# Patient Record
Sex: Male | Born: 1970
Health system: Southern US, Community
[De-identification: ages and names within clinical notes are randomized; demographics above are authoritative.]

## PROBLEM LIST (undated history)

## (undated) DIAGNOSIS — K219 Gastro-esophageal reflux disease without esophagitis: Secondary | ICD-10-CM

## (undated) DIAGNOSIS — Z8719 Personal history of other diseases of the digestive system: Secondary | ICD-10-CM

## (undated) HISTORY — PX: EXTERNAL EAR SURGERY: SHX627

## (undated) HISTORY — DX: Personal history of other diseases of the digestive system: Z87.19

## (undated) HISTORY — PX: OTHER SURGICAL HISTORY: SHX169

---

## 2013-07-27 ENCOUNTER — Encounter: Payer: Self-pay | Admitting: Internal Medicine

## 2013-09-14 ENCOUNTER — Encounter: Payer: Self-pay | Admitting: Internal Medicine

## 2013-09-17 ENCOUNTER — Ambulatory Visit: Payer: Self-pay | Admitting: Internal Medicine

## 2014-01-04 ENCOUNTER — Emergency Department (INDEPENDENT_AMBULATORY_CARE_PROVIDER_SITE_OTHER)
Admission: EM | Admit: 2014-01-04 | Discharge: 2014-01-04 | Disposition: A | Discharge status: Home / Self Care | Payer: Self-pay | Source: Home / Self Care | Attending: Emergency Medicine | Admitting: Emergency Medicine

## 2014-01-04 ENCOUNTER — Encounter (HOSPITAL_COMMUNITY): Payer: Self-pay | Admitting: Emergency Medicine

## 2014-01-04 DIAGNOSIS — K649 Unspecified hemorrhoids: Secondary | ICD-10-CM

## 2014-01-04 LAB — OCCULT BLOOD, POC DEVICE: Fecal Occult Bld: NEGATIVE

## 2014-01-04 MED ORDER — HYDROCORTISONE ACETATE 25 MG RE SUPP: 25.0000 mg | Freq: Two times a day (BID) | RECTAL | Status: DC

## 2014-01-04 MED ORDER — DOCUSATE SODIUM 100 MG PO CAPS: 100.0000 mg | ORAL_CAPSULE | Freq: Two times a day (BID) | ORAL | Status: DC

## 2014-01-04 MED ORDER — HYDROCORTISONE 1 % EX CREA: TOPICAL_CREAM | CUTANEOUS | Status: DC

## 2014-01-04 NOTE — ED Provider Notes (Signed)
Chief Complaint   Chief Complaint  Patient presents with  . Hemorrhoids    History of Present Illness   Philip Young is a 43 year old male who has had a one-week history of anal pain, small-volume, bright red hematochezia, with the pain radiating into his testicles and into his back and down his legs. He denies any constipation or diarrhea. He's had no bowel pain, fever, or chills. He denies any dysuria, frequency, urgency, or hematuria. He has no history of hemorrhoids or any other rectal lesions in the past.  Review of Systems   Other than as noted above, the patient denies any of the following symptoms: Constitutional:  No fever, chills, weight loss or anorexia. Lungs:  No shortness of breath. Heart:  No chest pain, dizziness or syncope. Abdomen:  No nausea, vomiting, hematememesis, melena, constipation, or diarrhea.  Philip Young   Past medical history, family history, social history, meds, and allergies were reviewed.    Physical Examination     Vital signs:  BP 112/75  Pulse 75  Temp(Src) 98.5 F (36.9 C) (Oral)  Resp 14  SpO2 99% Gen:  Alert, oriented, in no distress. Lungs:  Breath sounds clear and equal bilaterally.  No wheezes, rales or rhonchi. Heart:  Regular rhythm.  No gallops or murmers.   Abdomen:  Soft, nontender, no organomegaly or mass, bowel sounds were normal. Genital exam: He has several lesions on the penis that may be nevi or papillomas. There is nothing that looks like condyloma acuminata. No evidence of any STD, no urethral discharge. Skin:  Clear, warm and dry.  No rash.  Procedure Note:  Verbal informed consent was obtained from the patient.  Risks and benefits were outlined with the patient.  Patient understands and accepts these risks. A time out was called and the procedure and identity of the patient were confirmed verbally.    The procedure was then performed as follows:  External exam reveals some external hemorrhoid tags. These were not  bleeding. There were inflamed and tender to touch. Anoscopic exam was attempted, but unable to do, due to patient discomfort. Digital rectal exam was uncomfortable but I was able to palpate the help canal, but not the prostate. There were no masses, stool is heme negative.  The patient tolerated the procedure well without any immediate complications.   Labs   Results for orders placed during the hospital encounter of 01/04/14  OCCULT BLOOD, POC DEVICE      Result Value Ref Range   Fecal Occult Bld NEGATIVE  NEGATIVE     Assessment   The encounter diagnosis was Hemorrhoids, unspecified hemorrhoid type.  I wasn't able to see into the anus to make a good diagnosis of hemorrhoids. I feel he needs a colonoscopy since he has had hematochezia. He was given the name of Dr. Owens Loffler. I impressed upon him the importance of following up with Dr. Ardis Hughs whether he feels better or not. He was told that the differential diagnosis includes colon cancer. I also suggested he followup with a urologist for the penile lesions.  Plan   1.  Meds:  The following meds were prescribed:   Discharge Medication List as of 01/04/2014  8:04 PM    START taking these medications   Details  docusate sodium (COLACE) 100 MG capsule Take 1 capsule (100 mg total) by mouth every 12 (twelve) hours., Starting 01/04/2014, Until Discontinued, Normal    hydrocortisone (ANUSOL-HC) 25 MG suppository Place 1 suppository (25 mg total) rectally 2 (two)  times daily., Starting 01/04/2014, Until Discontinued, Normal    hydrocortisone cream 1 % Apply to penis 3 times daily, Normal        2.  Patient Education/Counseling:  The patient was given appropriate handouts, self care instructions, and instructed in symptomatic relief.  Suggested sitz baths and stool softeners.  3.  Follow up:  The patient was told to follow up here if no better in 3 to 4 days, or sooner if becoming worse in any way, and given some red flag symptoms such  as worsening bleeding, abdominal pain, dizziness or syncope which would prompt immediate return.  Followup with Dr. Owens Loffler for the hematochezia and Dr. Alexis Frock for the penile lesions.      Philip Mo, MD 01/04/14 2100

## 2014-01-04 NOTE — ED Notes (Signed)
Patient c/o pain and burning with bowel movements x 10 days. Patient denies blood in stool. Patient is alert and oriented and in NAD.

## 2014-01-04 NOTE — Discharge Instructions (Signed)

## 2014-03-06 ENCOUNTER — Emergency Department (HOSPITAL_COMMUNITY)
Admission: EM | Admit: 2014-03-06 | Discharge: 2014-03-06 | Disposition: A | Discharge status: Home / Self Care | Payer: Self-pay | Attending: Emergency Medicine | Admitting: Emergency Medicine

## 2014-03-06 ENCOUNTER — Emergency Department (HOSPITAL_COMMUNITY): Payer: Self-pay

## 2014-03-06 ENCOUNTER — Encounter (HOSPITAL_COMMUNITY): Payer: Self-pay | Admitting: Emergency Medicine

## 2014-03-06 DIAGNOSIS — Y9389 Activity, other specified: Secondary | ICD-10-CM | POA: Insufficient documentation

## 2014-03-06 DIAGNOSIS — S5002XA Contusion of left elbow, initial encounter: Secondary | ICD-10-CM | POA: Insufficient documentation

## 2014-03-06 DIAGNOSIS — S7012XA Contusion of left thigh, initial encounter: Secondary | ICD-10-CM | POA: Insufficient documentation

## 2014-03-06 DIAGNOSIS — Y998 Other external cause status: Secondary | ICD-10-CM | POA: Insufficient documentation

## 2014-03-06 DIAGNOSIS — S0990XA Unspecified injury of head, initial encounter: Secondary | ICD-10-CM | POA: Insufficient documentation

## 2014-03-06 DIAGNOSIS — Y92488 Other paved roadways as the place of occurrence of the external cause: Secondary | ICD-10-CM | POA: Insufficient documentation

## 2014-03-06 MED ORDER — IBUPROFEN 800 MG PO TABS: 800.0000 mg | ORAL_TABLET | Freq: Three times a day (TID) | ORAL | Status: DC

## 2014-03-06 MED ORDER — ACETAMINOPHEN 500 MG PO TABS: 500.0000 mg | ORAL_TABLET | Freq: Four times a day (QID) | ORAL | Status: DC | PRN

## 2014-03-06 MED ORDER — IBUPROFEN 800 MG PO TABS
800.0000 mg | ORAL_TABLET | Freq: Once | ORAL | Status: AC
  Administered 2014-03-06: 800 mg via ORAL
  Filled 2014-03-06: qty 1
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
## 2014-07-30 ENCOUNTER — Encounter: Payer: Self-pay | Admitting: Internal Medicine

## 2014-07-30 ENCOUNTER — Ambulatory Visit: Payer: Self-pay | Attending: Internal Medicine | Admitting: Internal Medicine

## 2014-07-30 VITALS — BP 133/78 | HR 75 | Temp 98.0°F | Resp 16 | Wt 206.8 lb

## 2014-07-30 DIAGNOSIS — D229 Melanocytic nevi, unspecified: Secondary | ICD-10-CM | POA: Insufficient documentation

## 2014-07-30 DIAGNOSIS — Z833 Family history of diabetes mellitus: Secondary | ICD-10-CM | POA: Insufficient documentation

## 2014-07-30 DIAGNOSIS — Z8719 Personal history of other diseases of the digestive system: Secondary | ICD-10-CM

## 2014-07-30 DIAGNOSIS — H538 Other visual disturbances: Secondary | ICD-10-CM | POA: Insufficient documentation

## 2014-07-30 DIAGNOSIS — K029 Dental caries, unspecified: Secondary | ICD-10-CM | POA: Insufficient documentation

## 2014-07-30 DIAGNOSIS — Z87898 Personal history of other specified conditions: Secondary | ICD-10-CM

## 2014-07-30 DIAGNOSIS — G8929 Other chronic pain: Secondary | ICD-10-CM | POA: Insufficient documentation

## 2014-07-30 DIAGNOSIS — M545 Low back pain: Secondary | ICD-10-CM | POA: Insufficient documentation

## 2014-07-30 DIAGNOSIS — Z0000 Encounter for general adult medical examination without abnormal findings: Secondary | ICD-10-CM | POA: Insufficient documentation

## 2014-07-30 DIAGNOSIS — K649 Unspecified hemorrhoids: Secondary | ICD-10-CM | POA: Insufficient documentation

## 2014-07-30 DIAGNOSIS — D225 Melanocytic nevi of trunk: Secondary | ICD-10-CM | POA: Insufficient documentation

## 2014-07-30 DIAGNOSIS — Z791 Long term (current) use of non-steroidal anti-inflammatories (NSAID): Secondary | ICD-10-CM | POA: Insufficient documentation

## 2014-07-30 HISTORY — DX: Personal history of other diseases of the digestive system: Z87.19

## 2014-07-30 LAB — COMPLETE METABOLIC PANEL WITH GFR
ALBUMIN: 4.4 g/dL (ref 3.5–5.2)
ALT: 48 U/L (ref 0–53)
AST: 30 U/L (ref 0–37)
Alkaline Phosphatase: 60 U/L (ref 39–117)
BILIRUBIN TOTAL: 0.5 mg/dL (ref 0.2–1.2)
BUN: 11 mg/dL (ref 6–23)
CO2: 23 mEq/L (ref 19–32)
CREATININE: 0.75 mg/dL (ref 0.50–1.35)
Calcium: 9.2 mg/dL (ref 8.4–10.5)
Chloride: 104 mEq/L (ref 96–112)
Glucose, Bld: 107 mg/dL — ABNORMAL HIGH (ref 70–99)
POTASSIUM: 4 meq/L (ref 3.5–5.3)
Sodium: 139 mEq/L (ref 135–145)
Total Protein: 6.9 g/dL (ref 6.0–8.3)

## 2014-07-30 LAB — TSH: TSH: 0.847 u[IU]/mL (ref 0.350–4.500)

## 2014-07-30 LAB — CBC WITH DIFFERENTIAL/PLATELET
Basophils Absolute: 0.1 10*3/uL (ref 0.0–0.1)
Basophils Relative: 1 % (ref 0–1)
EOS ABS: 0.1 10*3/uL (ref 0.0–0.7)
EOS PCT: 1 % (ref 0–5)
HEMATOCRIT: 44.1 % (ref 39.0–52.0)
HEMOGLOBIN: 15.3 g/dL (ref 13.0–17.0)
Lymphocytes Relative: 28 % (ref 12–46)
Lymphs Abs: 1.7 10*3/uL (ref 0.7–4.0)
MCH: 29.6 pg (ref 26.0–34.0)
MCHC: 34.7 g/dL (ref 30.0–36.0)
MCV: 85.3 fL (ref 78.0–100.0)
MONOS PCT: 11 % (ref 3–12)
MPV: 11.5 fL (ref 8.6–12.4)
Monocytes Absolute: 0.7 10*3/uL (ref 0.1–1.0)
Neutro Abs: 3.5 10*3/uL (ref 1.7–7.7)
Neutrophils Relative %: 59 % (ref 43–77)
Platelets: 218 10*3/uL (ref 150–400)
RBC: 5.17 MIL/uL (ref 4.22–5.81)
RDW: 13.5 % (ref 11.5–15.5)
WBC: 6 10*3/uL (ref 4.0–10.5)

## 2014-07-30 LAB — HEMOGLOBIN A1C
Hgb A1c MFr Bld: 5.9 % — ABNORMAL HIGH (ref <5.7)
Mean Plasma Glucose: 123 mg/dL — ABNORMAL HIGH (ref <117)

## 2014-07-30 MED ORDER — HYDROCORTISONE ACETATE 25 MG RE SUPP: 25.0000 mg | Freq: Two times a day (BID) | RECTAL | Status: DC

## 2014-07-30 MED ORDER — HYDROCORTISONE 1 % EX CREA: TOPICAL_CREAM | CUTANEOUS | Status: DC

## 2014-07-30 NOTE — Patient Instructions (Signed)

## 2014-07-30 NOTE — Progress Notes (Signed)
Patient here to establish care Patient states has not been to a doctor in a while and needs a physical Complains of hemorrhoids and using a cream to subside Also complains of having sciatic nerve
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
## 2014-07-31 ENCOUNTER — Telehealth: Payer: Self-pay

## 2014-07-31 LAB — VITAMIN D 25 HYDROXY (VIT D DEFICIENCY, FRACTURES): Vit D, 25-Hydroxy: 16 ng/mL — ABNORMAL LOW (ref 30–100)

## 2014-07-31 MED ORDER — VITAMIN D (ERGOCALCIFEROL) 1.25 MG (50000 UNIT) PO CAPS: 50000.0000 [IU] | ORAL_CAPSULE | ORAL | Status: DC

## 2014-07-31 NOTE — Telephone Encounter (Signed)
-----   Message from Lorayne Marek, MD sent at 07/31/2014 10:31 AM EDT ----- Blood work reviewed, noticed low vitamin D, call patient advise to start ergocalciferol 50,000 units once a week for the duration of  12 weeks, then take OTC vitamin d 2000 units daily. noticed hemoglobin A1c of 5.9%, patient has prediabetes, call and advise patient for low carbohydrate diet.

## 2014-07-31 NOTE — Telephone Encounter (Signed)
Patient is returning call from nurse, please f/u with pt.

## 2014-07-31 NOTE — Telephone Encounter (Signed)
Patient not available Left message on voice mail to return our call Prescription for vitamin D sent to [facility] on file

## 2014-08-01 NOTE — Telephone Encounter (Signed)
Patient is returning call from nurse, please f/u with pt.

## 2014-08-02 NOTE — Telephone Encounter (Signed)
Already had returned patient phone call and he is aware of his and his wife's results

## 2014-08-05 ENCOUNTER — Telehealth: Payer: Self-pay

## 2014-08-05 ENCOUNTER — Telehealth: Payer: Self-pay | Admitting: Internal Medicine

## 2014-08-05 NOTE — Telephone Encounter (Signed)
Patient called requesting to speak to nurse regarding medication, please f/u with patient

## 2014-08-05 NOTE — Telephone Encounter (Signed)
Returned patient phone call patient is having a hard time paying for his prescription And would like the prescriptions for his wife sent to our pharmacy

## 2014-08-07 ENCOUNTER — Telehealth: Payer: Self-pay

## 2014-08-07 NOTE — Telephone Encounter (Signed)
Returned patient phone call Patient not available Unable to leave message-mail box was full

## 2014-08-13 ENCOUNTER — Other Ambulatory Visit: Payer: Self-pay | Admitting: *Deleted

## 2014-08-13 ENCOUNTER — Telehealth: Payer: Self-pay | Admitting: Internal Medicine

## 2014-08-13 ENCOUNTER — Telehealth: Payer: Self-pay

## 2014-08-13 DIAGNOSIS — Z8719 Personal history of other diseases of the digestive system: Secondary | ICD-10-CM

## 2014-08-13 MED ORDER — HYDROCORTISONE ACETATE 25 MG RE SUPP: 25.0000 mg | Freq: Two times a day (BID) | RECTAL | Status: DC

## 2014-08-13 MED ORDER — HYDROCORTISONE 1 % EX CREA: TOPICAL_CREAM | CUTANEOUS | Status: DC

## 2014-08-13 MED ORDER — VITAMIN D (ERGOCALCIFEROL) 1.25 MG (50000 UNIT) PO CAPS: 50000.0000 [IU] | ORAL_CAPSULE | ORAL | Status: DC

## 2014-08-13 NOTE — Telephone Encounter (Signed)
Pt does not want to use [facility] because the medication is very expensive. Please follow up with [facility] to have his medications switched over to our pharmacy. Pt said that [facility] tried asking [facility] that but they were not able to do it for some reason.

## 2014-08-13 NOTE — Telephone Encounter (Signed)
Spoke with patient and he would like his and his wifes prescriptions transferred from [facility] [address] [facility]

## 2014-08-13 NOTE — Telephone Encounter (Signed)
Patient wanting his Rx transferred to our pharmacy

## 2014-09-16 ENCOUNTER — Encounter: Payer: Self-pay | Admitting: Internal Medicine

## 2014-09-16 ENCOUNTER — Ambulatory Visit: Payer: Self-pay | Attending: Internal Medicine | Admitting: Internal Medicine

## 2014-09-16 VITALS — BP 109/72 | HR 74 | Temp 98.0°F | Resp 16 | Wt 203.0 lb

## 2014-09-16 DIAGNOSIS — L298 Other pruritus: Secondary | ICD-10-CM | POA: Insufficient documentation

## 2014-09-16 DIAGNOSIS — S3093XA Unspecified superficial injury of penis, initial encounter: Secondary | ICD-10-CM | POA: Insufficient documentation

## 2014-09-16 DIAGNOSIS — K649 Unspecified hemorrhoids: Secondary | ICD-10-CM | POA: Insufficient documentation

## 2014-09-16 DIAGNOSIS — E559 Vitamin D deficiency, unspecified: Secondary | ICD-10-CM

## 2014-09-16 DIAGNOSIS — Z8719 Personal history of other diseases of the digestive system: Secondary | ICD-10-CM

## 2014-09-16 DIAGNOSIS — Z791 Long term (current) use of non-steroidal anti-inflammatories (NSAID): Secondary | ICD-10-CM | POA: Insufficient documentation

## 2014-09-16 DIAGNOSIS — Z87898 Personal history of other specified conditions: Secondary | ICD-10-CM

## 2014-09-16 DIAGNOSIS — X58XXXA Exposure to other specified factors, initial encounter: Secondary | ICD-10-CM | POA: Insufficient documentation

## 2014-09-16 DIAGNOSIS — L299 Pruritus, unspecified: Secondary | ICD-10-CM

## 2014-09-16 LAB — POCT URINALYSIS DIPSTICK
Bilirubin, UA: NEGATIVE
Glucose, UA: NEGATIVE
Ketones, UA: NEGATIVE
Leukocytes, UA: NEGATIVE
Nitrite, UA: NEGATIVE
RBC UA: NEGATIVE
SPEC GRAV UA: 1.025
Urobilinogen, UA: 0.2
pH, UA: 7

## 2014-09-16 MED ORDER — HYDROCORTISONE ACETATE 25 MG RE SUPP: 25.0000 mg | Freq: Two times a day (BID) | RECTAL | Status: DC

## 2014-09-16 MED ORDER — HYDROCORTISONE 1 % EX CREA
TOPICAL_CREAM | CUTANEOUS | Status: DC
Start: 2014-09-16 — End: 2015-09-17

## 2014-09-16 NOTE — Progress Notes (Signed)
Patient states his wife has some type of infection in her vaginal area Patient states now he is having some itchiness to his penis area

## 2014-09-16 NOTE — Progress Notes (Signed)
MRN: [id number] Name: [name]  Sex: male Age: 44 y.o. DOB: December 18, 1970  Allergies: Review of patient's allergies indicates no known allergies.  Chief Complaint  Patient presents with  . penile itch    HPI: Patient is 44 y.o. male who has history of hemorrhoids, he reported to have some itching in the penile area, denies any urinary symptoms denies any rash denies any discharge, patient reported to have his wife having some vaginal discharge, previous blood work reviewed with the patient noticed vitamin D deficiency patient is given prescription he is taking 50,000 units once a week.  History reviewed. No pertinent past medical history.  Past Surgical History  Procedure Laterality Date  . History of nose surgery         Medication List       This list is accurate as of: 09/16/14  3:13 PM.  Always use your most recent med list.               acetaminophen 500 MG tablet  Commonly known as:  TYLENOL  Take 1 tablet (500 mg total) by mouth every 6 (six) hours as needed.     docusate sodium 100 MG capsule  Commonly known as:  COLACE  Take 1 capsule (100 mg total) by mouth every 12 (twelve) hours.     hydrocortisone 25 MG suppository  Commonly known as:  ANUSOL-HC  Place 1 suppository (25 mg total) rectally 2 (two) times daily.     hydrocortisone cream 1 %  Apply to penis 3 times daily     ibuprofen 800 MG tablet  Commonly known as:  ADVIL,MOTRIN  Take 1 tablet (800 mg total) by mouth 3 (three) times daily.     Vitamin D (Ergocalciferol) 50000 UNITS Caps capsule  Commonly known as:  DRISDOL  Take 1 capsule (50,000 Units total) by mouth every 7 (seven) days.        Meds ordered this encounter  Medications  . hydrocortisone (ANUSOL-HC) 25 MG suppository    Sig: Place 1 suppository (25 mg total) rectally 2 (two) times daily.    Dispense:  12 suppository    Refill:  3  . hydrocortisone cream 1 %    Sig: Apply to penis 3 times daily    Dispense:  30 g   Refill:  2     There is no immunization history on file for this patient.  Family History  Problem Relation Age of Onset  . Diabetes Mother   . Cancer Mother     colon cancer   . Heart disease Father   . Stroke Father     History  Substance Use Topics  . Smoking status: Never Smoker   . Smokeless tobacco: Not on file  . Alcohol Use: No    Review of Systems   As noted in HPI  Filed Vitals:   09/16/14 1441  BP: 109/72  Pulse: 74  Temp: 98 F (36.7 C)  Resp: 16    Physical Exam  Physical Exam  Constitutional: No distress.  Eyes: EOM are normal. Pupils are equal, round, and reactive to light.  Cardiovascular: Normal rate and regular rhythm.   Pulmonary/Chest: Breath sounds normal. No respiratory distress. He has no wheezes. He has no rales.  Genitourinary:  Minor superficial cut noticed on shaft of penis no apparent discharge, no other rash or lymphadenopathy, testes nontender    CBC    Component Value Date/Time   WBC 6.0 07/30/2014 1514   RBC  5.17 07/30/2014 1514   HGB 15.3 07/30/2014 1514   HCT 44.1 07/30/2014 1514   PLT 218 07/30/2014 1514   MCV 85.3 07/30/2014 1514   LYMPHSABS 1.7 07/30/2014 1514   MONOABS 0.7 07/30/2014 1514   EOSABS 0.1 07/30/2014 1514   BASOSABS 0.1 07/30/2014 1514    CMP     Component Value Date/Time   NA 139 07/30/2014 1514   K 4.0 07/30/2014 1514   CL 104 07/30/2014 1514   CO2 23 07/30/2014 1514   GLUCOSE 107* 07/30/2014 1514   BUN 11 07/30/2014 1514   CREATININE 0.75 07/30/2014 1514   CALCIUM 9.2 07/30/2014 1514   PROT 6.9 07/30/2014 1514   ALBUMIN 4.4 07/30/2014 1514   AST 30 07/30/2014 1514   ALT 48 07/30/2014 1514   ALKPHOS 60 07/30/2014 1514   BILITOT 0.5 07/30/2014 1514   GFRNONAA >89 07/30/2014 1514   GFRAA >89 07/30/2014 1514    No results found for: CHOL  Lab Results  Component Value Date/Time   HGBA1C 5.9* 07/30/2014 03:14 PM    Lab Results  Component Value Date/Time   AST 30 07/30/2014 03:14  PM    Assessment and Plan  Penile Itch - Plan:  Results for orders placed or performed in visit on 09/16/14  Urinalysis Dipstick  Result Value Ref Range   Color, UA yellow    Clarity, UA clear    Glucose, UA neg    Bilirubin, UA neg    Ketones, UA neg    Spec Grav, UA 1.025    Blood, UA neg    pH, UA 7.0    Protein, UA trace    Urobilinogen, UA 0.2    Nitrite, UA neg    Leukocytes, UA Negative    Urinalysis Dipstick negative for infection, examination benign, minor superficial cut, have prescribed hydrocortisone cream 1 %, patient can also try over-the-counter A&D ointment,patient's wife is going to be tested for vaginal discharge, if she is positive for any STDs, patient will be treated as well.  History of hemorrhoids - Plan: hydrocortisone (ANUSOL-HC) 25 MG suppository, hydrocortisone cream 1 %  Vitamin D deficiency Patient start taking vitamin D supplement   Return in about 3 months (around 12/17/2014), or if symptoms worsen or fail to improve.   This note has been created with Surveyor, quantity. Any transcriptional errors are unintentional.    Lorayne Marek, MD

## 2014-09-17 ENCOUNTER — Telehealth: Payer: Self-pay | Admitting: Internal Medicine

## 2014-09-17 NOTE — Telephone Encounter (Signed)
Patient had an appointment yesterday and after discharge asked if he could put in a request for Potassium and Vitamin b12; please f/u with patient about this request

## 2015-05-26 ENCOUNTER — Ambulatory Visit: Payer: Self-pay | Admitting: Internal Medicine

## 2015-08-13 ENCOUNTER — Ambulatory Visit: Payer: Self-pay

## 2015-09-16 ENCOUNTER — Encounter (HOSPITAL_COMMUNITY): Payer: Self-pay | Admitting: *Deleted

## 2015-09-16 DIAGNOSIS — N200 Calculus of kidney: Secondary | ICD-10-CM | POA: Insufficient documentation

## 2015-09-16 DIAGNOSIS — Z79899 Other long term (current) drug therapy: Secondary | ICD-10-CM | POA: Insufficient documentation

## 2015-09-16 LAB — CBC WITH DIFFERENTIAL/PLATELET
BASOS ABS: 0 10*3/uL (ref 0.0–0.1)
BASOS PCT: 1 %
Eosinophils Absolute: 0.1 10*3/uL (ref 0.0–0.7)
Eosinophils Relative: 1 %
HEMATOCRIT: 40.8 % (ref 39.0–52.0)
HEMOGLOBIN: 14.2 g/dL (ref 13.0–17.0)
Lymphocytes Relative: 36 %
Lymphs Abs: 2.1 10*3/uL (ref 0.7–4.0)
MCH: 29.2 pg (ref 26.0–34.0)
MCHC: 34.8 g/dL (ref 30.0–36.0)
MCV: 84 fL (ref 78.0–100.0)
Monocytes Absolute: 0.8 10*3/uL (ref 0.1–1.0)
Monocytes Relative: 14 %
NEUTROS ABS: 2.9 10*3/uL (ref 1.7–7.7)
Neutrophils Relative %: 49 %
Platelets: 207 10*3/uL (ref 150–400)
RBC: 4.86 MIL/uL (ref 4.22–5.81)
RDW: 12.3 % (ref 11.5–15.5)
WBC: 5.8 10*3/uL (ref 4.0–10.5)

## 2015-09-16 MED ORDER — OXYCODONE-ACETAMINOPHEN 5-325 MG PO TABS
1.0000 | ORAL_TABLET | Freq: Once | ORAL | Status: AC
  Administered 2015-09-16: 1 via ORAL

## 2015-09-16 MED ORDER — OXYCODONE-ACETAMINOPHEN 5-325 MG PO TABS
ORAL_TABLET | ORAL | Status: AC
  Filled 2015-09-16: qty 1

## 2015-09-16 NOTE — ED Notes (Signed)
Patient initially stated he was having trouble using the bathroom - voided small amount dark urine.  Then he stated he was having trouble having a BM and asked this nurse for something to make him use the bathroom easier.  The pain comes around the left side to the left groin and abd area.  Stated he was fasting then he ate some vegetables and started with this pain

## 2015-09-16 NOTE — ED Notes (Signed)
Patient presents with difficulty urinating

## 2015-09-17 ENCOUNTER — Emergency Department (HOSPITAL_COMMUNITY)
Admission: EM | Admit: 2015-09-17 | Discharge: 2015-09-17 | Disposition: A | Discharge status: Home / Self Care | Payer: Self-pay | Attending: Emergency Medicine | Admitting: Emergency Medicine

## 2015-09-17 ENCOUNTER — Emergency Department (HOSPITAL_COMMUNITY): Payer: Self-pay

## 2015-09-17 ENCOUNTER — Encounter (HOSPITAL_COMMUNITY): Payer: Self-pay | Admitting: Radiology

## 2015-09-17 DIAGNOSIS — N200 Calculus of kidney: Secondary | ICD-10-CM

## 2015-09-17 LAB — URINALYSIS, ROUTINE W REFLEX MICROSCOPIC
BILIRUBIN URINE: NEGATIVE
Glucose, UA: NEGATIVE mg/dL
Ketones, ur: 15 mg/dL — AB
Leukocytes, UA: NEGATIVE
Nitrite: NEGATIVE
Protein, ur: 30 mg/dL — AB
SPECIFIC GRAVITY, URINE: 1.033 — AB (ref 1.005–1.030)
pH: 6 (ref 5.0–8.0)

## 2015-09-17 LAB — BASIC METABOLIC PANEL
Anion gap: 6 (ref 5–15)
BUN: 10 mg/dL (ref 6–20)
CHLORIDE: 107 mmol/L (ref 101–111)
CO2: 25 mmol/L (ref 22–32)
CREATININE: 1 mg/dL (ref 0.61–1.24)
Calcium: 9.1 mg/dL (ref 8.9–10.3)
GFR calc Af Amer: >60 mL/min (ref >60)
GFR calc non Af Amer: >60 mL/min (ref >60)
Glucose, Bld: 116 mg/dL — ABNORMAL HIGH (ref 65–99)
Potassium: 3.9 mmol/L (ref 3.5–5.1)
Sodium: 138 mmol/L (ref 135–145)

## 2015-09-17 LAB — URINE MICROSCOPIC-ADD ON

## 2015-09-17 MED ORDER — OXYCODONE-ACETAMINOPHEN 5-325 MG PO TABS: 1.0000 | ORAL_TABLET | Freq: Four times a day (QID) | ORAL | Status: DC | PRN

## 2015-09-17 MED ORDER — TAMSULOSIN HCL 0.4 MG PO CAPS: 0.4000 mg | ORAL_CAPSULE | Freq: Every day | ORAL | Status: DC

## 2015-09-17 MED ORDER — KETOROLAC TROMETHAMINE 30 MG/ML IJ SOLN
30.0000 mg | Freq: Once | INTRAMUSCULAR | Status: AC
  Administered 2015-09-17: 30 mg via INTRAVENOUS
  Filled 2015-09-17: qty 1

## 2015-09-17 MED ORDER — ONDANSETRON 4 MG PO TBDP: 4.0000 mg | ORAL_TABLET | Freq: Three times a day (TID) | ORAL | Status: DC | PRN

## 2015-09-17 MED ORDER — IBUPROFEN 800 MG PO TABS: 800.0000 mg | ORAL_TABLET | Freq: Three times a day (TID) | ORAL | Status: DC | PRN

## 2015-09-17 MED ORDER — SODIUM CHLORIDE 0.9 % IV BOLUS (SEPSIS)
1000.0000 mL | Freq: Once | INTRAVENOUS | Status: AC
  Administered 2015-09-17: 1000 mL via INTRAVENOUS

## 2015-09-17 MED ORDER — ONDANSETRON HCL 4 MG/2ML IJ SOLN
4.0000 mg | Freq: Once | INTRAMUSCULAR | Status: AC
  Administered 2015-09-17: 4 mg via INTRAVENOUS
  Filled 2015-09-17: qty 2

## 2015-09-17 MED FILL — ONDANSETRON ODT 4 MG TABLET: 4 | 6 days supply | Qty: 20 | Fill #0

## 2015-09-17 MED FILL — IBUPROFEN 800 MG TABLET: 800 | 10 days supply | Qty: 30 | Fill #0

## 2015-09-17 MED FILL — TAMSULOSIN HCL 0.4 MG CAP: 0.4 | 15 days supply | Qty: 15 | Fill #0

## 2015-09-17 MED FILL — OXYCODONE/APAP 5-325: 5-325 | 3 days supply | Qty: 20 | Fill #0

## 2015-09-17 NOTE — ED Provider Notes (Signed)
By signing my name below, I, Randa Evens, attest that this documentation has been prepared under the direction and in the presence of [employer], DO. Electronically Signed: Randa Evens, ED Scribe. 09/17/2015. 3:22 AM.  TIME SEEN: 3:22 AM  CHIEF COMPLAINT: difficulty urinating   HPI: HPI Comments: Philip Young is a 45 y.o. male who presents to the Emergency Department complaining of difficulty urinating onset today. Pt reports that he has associated dysuria and left flank pain radiating to his abdomen. Pt states that his symptoms were worse today after eating spicy food. Pt states that he also believes that his symptoms are worse because he is fasting. He doesn't report any aggravating or alleviating factors. Denies nausea, vomiting or fever. Denies HX of abdominal surgery. No history of back injury. No history of kidney stones.    ROS: See HPI Constitutional: no fever  Eyes: no drainage  ENT: no runny nose   Cardiovascular:  no chest pain  Resp: no SOB  GI: no vomiting GU:  dysuria Integumentary: no rash  Allergy: no hives  Musculoskeletal: no leg swelling  Neurological: no slurred speech ROS otherwise negative  PAST MEDICAL HISTORY/PAST SURGICAL HISTORY:  History reviewed. No pertinent past medical history.  MEDICATIONS:  Prior to Admission medications   Medication Sig Start Date End Date Taking? Authorizing Provider  acetaminophen (TYLENOL) 500 MG tablet Take 1 tablet (500 mg total) by mouth every 6 (six) hours as needed. Patient taking differently: Take 500 mg by mouth every 6 (six) hours as needed for mild pain.  03/06/14  Yes Jennifer Piepenbrink, PA-C  docusate sodium (COLACE) 100 MG capsule Take 1 capsule (100 mg total) by mouth every 12 (twelve) hours. 01/04/14  Yes Harden Mo, MD    ALLERGIES:  No Known Allergies  SOCIAL HISTORY:  Social History  Substance Use Topics  . Smoking status: Never Smoker   . Smokeless tobacco: Never Used  . Alcohol  Use: No    FAMILY HISTORY: Family History  Problem Relation Age of Onset  . Diabetes Mother   . Cancer Mother     colon cancer   . Heart disease Father   . Stroke Father     EXAM: BP 128/69 mmHg  Pulse 78  Temp(Src) 97.5 F (36.4 C) (Oral)  Resp 18  Ht 5\' 7"  (1.702 m)  Wt 210 lb (95.255 kg)  BMI 32.88 kg/m2  SpO2 96%   CONSTITUTIONAL: Alert and oriented and responds appropriately to questions. Well-appearing; well-nourished HEAD: Normocephalic EYES: Conjunctivae clear, PERRL ENT: normal nose; no rhinorrhea; dry mucous membranes NECK: Supple, no meningismus, no LAD  CARD: RRR; S1 and S2 appreciated; no murmurs, no clicks, no rubs, no gallops RESP: Normal chest excursion without splinting or tachypnea; breath sounds clear and equal bilaterally; no wheezes, no rhonchi, no rales, no hypoxia or respiratory distress, speaking full sentences ABD/GI: Normal bowel sounds; non-distended; soft, non-tender, no rebound, no guarding, no peritoneal signs BACK:  The back appears normal and is non-tender to palpation, there is no CVA tenderness EXT: Normal ROM in all joints; non-tender to palpation; no edema; normal capillary refill; no cyanosis, no calf tenderness or swelling    SKIN: Normal color for age and race; warm; no rash NEURO: Moves all extremities equally, sensation to light touch intact diffusely, cranial nerves II through XII intact PSYCH: The patient's mood and manner are appropriate. Grooming and personal hygiene are appropriate.  MEDICAL DECISION MAKING: Patient here with left flank pain that radiates into the abdomen,  pain with urination. He has been able to urinate several times in the emergency department. Suspect possible kidney stone. Urine shows blood but no other sign of infection. Labs unremarkable. We'll obtain CT of his abdomen and pelvis. We'll give IV fluids, Toradol, Zofran.  ED PROGRESS: Patient reports feeling much better. He is smiling, laughing. CT scan showed  a 2 mm distal left ureteral calculus with mild hydronephrosis. We'll discharge with prescriptions for Percocet, ibuprofen, Flomax, Zofran. We'll give urine strainer and outpatient urology follow-up information. Have recommended increased fluid intake. Discussed return precautions. Patient verbalizes understanding and is comfortable with this plan.    At this time, I do not feel there is any life-threatening condition present. I have reviewed and discussed all results (EKG, imaging, lab, urine as appropriate), exam findings with patient. I have reviewed nursing notes and appropriate previous records.  I feel the patient is safe to be discharged home without further emergent workup. Discussed usual and customary return precautions. Patient and family (if present) verbalize understanding and are comfortable with this plan.  Patient will follow-up with their primary care provider. If they do not have a primary care provider, information for follow-up has been provided to them. All questions have been answered.  I personally performed the services described in this documentation, which was scribed in my presence. The recorded information has been reviewed and is accurate.    [place], DO 09/17/15 0505

## 2015-09-17 NOTE — ED Notes (Signed)
Patient verbalized understanding of discharge instructions and denies any further needs or questions at this time. VS stable. Patient ambulatory with steady gait, assisted to ED entrance in wheelchair.  

## 2015-09-17 NOTE — ED Notes (Signed)
Provided patient with urine strainer. 

## 2015-09-17 NOTE — Discharge Instructions (Signed)
Dietary Guidelines to Help Prevent Kidney Stones °Your risk of kidney stones can be decreased by adjusting the foods you eat. The most important thing you can do is drink enough fluid. You should drink enough fluid to keep your urine clear or pale yellow. The following guidelines provide specific information for the type of kidney stone you have had. °GUIDELINES ACCORDING TO TYPE OF KIDNEY STONE °Calcium Oxalate Kidney Stones °· Reduce the amount of salt you eat. Foods that have a lot of salt cause your body to release excess calcium into your urine. The excess calcium can combine with a substance called oxalate to form kidney stones. °· Reduce the amount of animal protein you eat if the amount you eat is excessive. Animal protein causes your body to release excess calcium into your urine. Ask your dietitian how much protein from animal sources you should be eating. °· Avoid foods that are high in oxalates. If you take vitamins, they should have less than 500 mg of vitamin C. Your body turns vitamin C into oxalates. You do not need to avoid fruits and vegetables high in vitamin C. °Calcium Phosphate Kidney Stones °· Reduce the amount of salt you eat to help prevent the release of excess calcium into your urine. °· Reduce the amount of animal protein you eat if the amount you eat is excessive. Animal protein causes your body to release excess calcium into your urine. Ask your dietitian how much protein from animal sources you should be eating. °· Get enough calcium from food or take a calcium supplement (ask your dietitian for recommendations). Food sources of calcium that do not increase your risk of kidney stones include: °· Broccoli. °· Dairy products, such as cheese and yogurt. °· Pudding. °Uric Acid Kidney Stones °· Do not have more than 6 oz of animal protein per day. °FOOD SOURCES °Animal Protein Sources °· Meat (all types). °· Poultry. °· Eggs. °· Fish, seafood. °Foods High in Salt °· Salt seasonings. °· Soy  sauce. °· Teriyaki sauce. °· Cured and processed meats. °· Salted crackers and snack foods. °· Fast food. °· Canned soups and most canned foods. °Foods High in Oxalates °· Grains: °· Amaranth. °· Barley. °· Grits. °· Wheat germ. °· Bran. °· Buckwheat flour. °· All bran cereals. °· Pretzels. °· Whole wheat bread. °· Vegetables: °· Beans (wax). °· Beets and beet greens. °· Collard greens. °· Eggplant. °· Escarole. °· Leeks. °· Okra. °· Parsley. °· Rutabagas. °· Spinach. °· Swiss chard. °· Tomato paste. °· Fried potatoes. °· Sweet potatoes. °· Fruits: °· Red currants. °· Figs. °· Kiwi. °· Rhubarb. °· Meat and Other Protein Sources: °· Beans (dried). °· Soy burgers and other soybean products. °· Miso. °· Nuts (peanuts, almonds, pecans, cashews, hazelnuts). °· Nut butters. °· Sesame seeds and tahini (paste made of sesame seeds). °· Poppy seeds. °· Beverages: °· Chocolate drink mixes. °· Soy milk. °· Instant iced tea. °· Juices made from high-oxalate fruits or vegetables. °· Other: °· Carob. °· Chocolate. °· Fruitcake. °· Marmalades. °  °This information is not intended to replace advice given to you by your health care provider. Make sure you discuss any questions you have with your health care provider. °  °Document Released: 07/24/2010 Document Revised: 04/03/2013 Document Reviewed: 02/23/2013 °Elsevier Interactive Patient Education ©2016 Else[employer] Inc[employer] ° °Kidney Stones °Kidney stones (urolithiasis) are deposits that form inside your kidneys. The intense pain is caused by the stone moving through the urinary tract. When the stone moves, the ureter   goes into spasm around the stone. The stone is usually passed in the urine.  °CAUSES  °· A disorder that makes certain neck glands produce too much parathyroid hormone (primary hyperparathyroidism). °· A buildup of uric acid crystals, similar to gout in your joints. °· Narrowing (stricture) of the ureter. °· A kidney obstruction present at birth (congenital  obstruction). °· Previous surgery on the kidney or ureters. °· Numerous kidney infections. °SYMPTOMS  °· Feeling sick to your stomach (nauseous). °· Throwing up (vomiting). °· Blood in the urine (hematuria). °· Pain that usually spreads (radiates) to the groin. °· Frequency or urgency of urination. °DIAGNOSIS  °· Taking a history and physical exam. °· Blood or urine tests. °· CT scan. °· Occasionally, an examination of the inside of the urinary bladder (cystoscopy) is performed. °TREATMENT  °· Observation. °· Increasing your fluid intake. °· Extracorporeal shock wave lithotripsy--This is a noninvasive procedure that uses shock waves to break up kidney stones. °· Surgery may be needed if you have severe pain or persistent obstruction. There are various surgical procedures. Most of the procedures are performed with the use of small instruments. Only small incisions are needed to accommodate these instruments, so recovery time is minimized. °The size, location, and chemical composition are all important variables that will determine the proper choice of action for you. Talk to your health care provider to better understand your situation so that you will minimize the risk of injury to yourself and your kidney.  °HOME CARE INSTRUCTIONS  °· Drink enough water and fluids to keep your urine clear or pale yellow. This will help you to pass the stone or stone fragments. °· Strain all urine through the provided strainer. Keep all particulate matter and stones for your health care provider to see. The stone causing the pain may be as small as a grain of salt. It is very important to use the strainer each and every time you pass your urine. The collection of your stone will allow your health care provider to analyze it and verify that a stone has actually passed. The stone analysis will often identify what you can do to reduce the incidence of recurrences. °· Only take over-the-counter or prescription medicines for pain,  discomfort, or fever as directed by your health care provider. °· Keep all follow-up visits as told by your health care provider. This is important. °· Get follow-up X-rays if required. The absence of pain does not always mean that the stone has passed. It may have only stopped moving. If the urine remains completely obstructed, it can cause loss of kidney function or even complete destruction of the kidney. It is your responsibility to make sure X-rays and follow-ups are completed. Ultrasounds of the kidney can show blockages and the status of the kidney. Ultrasounds are not associated with any radiation and can be performed easily in a matter of minutes. °· Make changes to your daily diet as told by your health care provider. You may be told to: °¨ Limit the amount of salt that you eat. °¨ Eat 5 or more servings of fruits and vegetables each day. °¨ Limit the amount of meat, poultry, fish, and eggs that you eat. °· Collect a 24-hour urine sample as told by your health care provider. You may need to collect another urine sample every 6-12 months. °SEEK MEDICAL CARE IF: °· You experience pain that is progressive and unresponsive to any pain medicine you have been prescribed. °SEEK IMMEDIATE MEDICAL CARE IF:  °· Pain   cannot be controlled with the prescribed medicine.  You have a fever or shaking chills.  The severity or intensity of pain increases over 18 hours and is not relieved by pain medicine.  You develop a new onset of abdominal pain.  You feel faint or pass out.  You are unable to urinate.   This information is not intended to replace advice given to you by your health care provider. Make sure you discuss any questions you have with your health care provider.   Document Released: 03/29/2005 Document Revised: 12/18/2014 Document Reviewed: 08/30/2012 Elsevier Interactive Patient Education [employer].

## 2015-09-26 ENCOUNTER — Ambulatory Visit: Payer: Self-pay | Attending: Internal Medicine

## 2015-10-08 ENCOUNTER — Ambulatory Visit: Payer: Self-pay | Admitting: Family Medicine

## 2015-11-10 ENCOUNTER — Encounter: Payer: Self-pay | Admitting: Family Medicine

## 2015-11-10 ENCOUNTER — Ambulatory Visit: Payer: Self-pay | Attending: Family Medicine | Admitting: Family Medicine

## 2015-11-10 VITALS — BP 124/77 | HR 90 | Temp 98.6°F | Ht 70.25 in | Wt 205.4 lb

## 2015-11-10 DIAGNOSIS — N489 Disorder of penis, unspecified: Secondary | ICD-10-CM

## 2015-11-10 DIAGNOSIS — Z79899 Other long term (current) drug therapy: Secondary | ICD-10-CM | POA: Insufficient documentation

## 2015-11-10 DIAGNOSIS — M722 Plantar fascial fibromatosis: Secondary | ICD-10-CM | POA: Insufficient documentation

## 2015-11-10 DIAGNOSIS — H9202 Otalgia, left ear: Secondary | ICD-10-CM | POA: Insufficient documentation

## 2015-11-10 DIAGNOSIS — R888 Abnormal findings in other body fluids and substances: Secondary | ICD-10-CM | POA: Insufficient documentation

## 2015-11-10 DIAGNOSIS — Z13228 Encounter for screening for other metabolic disorders: Secondary | ICD-10-CM

## 2015-11-10 DIAGNOSIS — B353 Tinea pedis: Secondary | ICD-10-CM | POA: Insufficient documentation

## 2015-11-10 MED ORDER — MELOXICAM 7.5 MG PO TABS: 7.5000 mg | ORAL_TABLET | Freq: Every day | ORAL | 1 refills | Status: DC

## 2015-11-10 MED ORDER — TERBINAFINE HCL 1 % EX CREA: 1.0000 "application " | TOPICAL_CREAM | Freq: Two times a day (BID) | CUTANEOUS | 1 refills | Status: DC

## 2015-11-10 MED FILL — MELOXICAM 7.5 MG TABLET: 7.5 | 30 days supply | Qty: 30 | Fill #0

## 2015-11-10 NOTE — Progress Notes (Signed)
Subjective:  Patient ID: Philip Young, male    DOB: 1970-07-07  Age: 45 y.o. MRN: [id number]  CC: Annual Exam; Otalgia (left ear); Sore Throat (4 days); Foot Pain (left greater then right  dry peeling skin that itches); and Other (brown skin on the penis)   HPI [name] presents With multiple complaints.  Complains of pain in the heel of his left foot worse on waking up in the morning which occurs daily, also has intermittent cramping of his calf 2 times a week which is absent at this time. He has itching in between the toes of his left foot and some peeling of the skin of his right foot. Complains of left ear pain and sore throat for 4 days but denies runny nose, sinus congestion, fever or headaches. He is concerned about some brownish discoloration of his penis but denies discharge.  He would like to be screened for cancer and is wondering what options are available. I have discussed with him screening for colon cancer from 50 and above unless if he has GI symptoms. (Patient's mom was diagnosed with cancer at age of 80) We have also discussed prostate cancer screening and the risk and benefit.  He does smoke intermittently and I have advised him that if he is concerned about cancer it would be in his interest to quit smoking.  Outpatient Medications Prior to Visit  Medication Sig Dispense Refill  . docusate sodium (COLACE) 100 MG capsule Take 1 capsule (100 mg total) by mouth every 12 (twelve) hours. 30 capsule 0  . ibuprofen (ADVIL,MOTRIN) 800 MG tablet Take 1 tablet (800 mg total) by mouth every 8 (eight) hours as needed for mild pain. (Patient not taking: Reported on 11/10/2015) 30 tablet 0  . ondansetron (ZOFRAN ODT) 4 MG disintegrating tablet Take 1 tablet (4 mg total) by mouth every 8 (eight) hours as needed for nausea or vomiting. (Patient not taking: Reported on 11/10/2015) 20 tablet 0  . oxyCODONE-acetaminophen (PERCOCET/ROXICET) 5-325 MG tablet Take 1-2 tablets by mouth  every 6 (six) hours as needed. (Patient not taking: Reported on 11/10/2015) 20 tablet 0  . tamsulosin (FLOMAX) 0.4 MG CAPS capsule Take 1 capsule (0.4 mg total) by mouth daily. (Patient not taking: Reported on 11/10/2015) 15 capsule 0   No facility-administered medications prior to visit.     ROS Review of Systems  Constitutional: Negative for activity change and appetite change.  HENT: Positive for ear pain and sore throat. Negative for sinus pressure.   Eyes: Negative for visual disturbance.  Respiratory: Negative for cough, chest tightness and shortness of breath.   Cardiovascular: Negative for chest pain and leg swelling.  Gastrointestinal: Negative for abdominal distention, abdominal pain, constipation and diarrhea.  Endocrine: Negative.   Genitourinary: Negative for discharge, dysuria and penile pain.  Musculoskeletal: Negative for joint swelling and myalgias.  Skin: Positive for rash.  Allergic/Immunologic: Negative.   Neurological: Negative for weakness, light-headedness and numbness.  Psychiatric/Behavioral: Negative for dysphoric mood and suicidal ideas.    Objective:  BP 124/77 (BP Location: Left Arm, Patient Position: Sitting, Cuff Size: Large)   Pulse 90   Temp 98.6 F (37 C) (Oral)   Ht 5' 10.25" (1.784 m)   Wt 205 lb 6.4 oz (93.2 kg)   SpO2 97%   BMI 29.26 kg/m   BP/Weight 11/10/2015 [id number]  Systolic BP [id number] -  Diastolic BP 77 77 -  Wt. (Lbs) 205.4 - 210  BMI 29.26 32.88 -  Physical Exam  Constitutional: He is oriented to person, place, and time. He appears well-developed and well-nourished.  HENT:  Left ear-cerumen obscuring TM Right ear-no cerumen  Cardiovascular: Normal rate, normal heart sounds and intact distal pulses.   No murmur heard. Pulmonary/Chest: Effort normal and breath sounds normal. He has no wheezes. He has no rales. He exhibits no tenderness.  Abdominal: Soft. Bowel sounds are normal. He exhibits no distension and no  mass. There is no tenderness.  Genitourinary:  Genitourinary Comments: Brownish colored rings sparsely distributed around penis. No penile discharge  Musculoskeletal: Normal range of motion.  Neurological: He is alert and oriented to person, place, and time.  Skin:  Tinea pedis in webspaces of left foot     Assessment & Plan:   1. Otalgia, left Secondary to cerumen impaction Advised to use OTC ceruminolytics  2. Plantar fasciitis of left foot Advised to use insole - meloxicam (MOBIC) 7.5 MG tablet; Take 1 tablet (7.5 mg total) by mouth daily.  Dispense: 30 tablet; Refill: 1  3. Penile abnormality He does have some brownish discoloration of his penis but no evidence of STD - GC/Chlamydia Probe Amp  4. Tinea pedis of left foot - terbinafine (LAMISIL AT) 1 % cream; Apply 1 application topically 2 (two) times daily.  Dispense: 30 g; Refill: 1  5. Screening for metabolic disorder - COMPLETE METABOLIC PANEL WITH GFR; Future - Lipid panel; Future   Meds ordered this encounter  Medications  . terbinafine (LAMISIL AT) 1 % cream    Sig: Apply 1 application topically 2 (two) times daily.    Dispense:  30 g    Refill:  1  . meloxicam (MOBIC) 7.5 MG tablet    Sig: Take 1 tablet (7.5 mg total) by mouth daily.    Dispense:  30 tablet    Refill:  1    Follow-up: Return in about 6 months (around 05/12/2016) for coordination of care.   Arnoldo Morale MD

## 2015-11-11 NOTE — Addendum Note (Signed)
Addended by: Tommas Olp B on: 11/11/2015 02:58 PM   Modules accepted: Orders

## 2015-11-12 LAB — CYTOLOGY, (ORAL, ANAL, URETHRAL) ANCILLARY ONLY
CHLAMYDIA, DNA PROBE: NEGATIVE
NEISSERIA GONORRHEA: NEGATIVE

## 2015-11-13 ENCOUNTER — Other Ambulatory Visit: Payer: Self-pay

## 2015-11-14 ENCOUNTER — Telehealth: Payer: Self-pay

## 2015-11-14 NOTE — Telephone Encounter (Signed)
-----   Message from Arnoldo Morale, MD sent at 11/12/2015  1:53 PM EDT ----- Please inform the patient that labs are normal. Thank you.

## 2015-11-14 NOTE — Telephone Encounter (Signed)
Writer called patient per Dr. Jarold Song to inform him that his culture results were both negative.  Patient stated understanding.

## 2015-11-19 ENCOUNTER — Other Ambulatory Visit: Payer: Self-pay

## 2016-03-12 ENCOUNTER — Ambulatory Visit: Payer: Self-pay | Attending: Family Medicine

## 2016-05-14 ENCOUNTER — Encounter: Payer: Self-pay | Admitting: Physician Assistant

## 2016-05-14 ENCOUNTER — Ambulatory Visit: Payer: Self-pay | Attending: Physician Assistant | Admitting: Physician Assistant

## 2016-05-14 ENCOUNTER — Telehealth: Payer: Self-pay | Admitting: *Deleted

## 2016-05-14 VITALS — BP 127/82 | HR 84 | Temp 98.4°F | Resp 20 | Wt 213.4 lb

## 2016-05-14 DIAGNOSIS — R1031 Right lower quadrant pain: Secondary | ICD-10-CM

## 2016-05-14 DIAGNOSIS — B9789 Other viral agents as the cause of diseases classified elsewhere: Secondary | ICD-10-CM

## 2016-05-14 DIAGNOSIS — M722 Plantar fascial fibromatosis: Secondary | ICD-10-CM

## 2016-05-14 DIAGNOSIS — J069 Acute upper respiratory infection, unspecified: Secondary | ICD-10-CM

## 2016-05-14 MED ORDER — SALINE SPRAY 0.65 % NA SOLN: 1.0000 | NASAL | 0 refills | Status: DC | PRN

## 2016-05-14 MED ORDER — IBUPROFEN 800 MG PO TABS: 800.0000 mg | ORAL_TABLET | Freq: Three times a day (TID) | ORAL | 0 refills | Status: DC | PRN

## 2016-05-14 MED ORDER — PHENYLEPHRINE-DM-GG-APAP 5-10-200-325 MG/10ML PO LIQD: 20.0000 mL | ORAL | 0 refills | Status: AC

## 2016-05-14 NOTE — Progress Notes (Signed)
Patient ID: Philip Young, male   DOB: May 24, 1970, 46 y.o.   MRN: [id number]    Subjective:  Patient ID: Philip Young, male    DOB: 1970-09-14  Age: 46 y.o. MRN: [id number]  CC: Cold, foot pain, RLQ pain  HPI Philip Young is a 46 y.o. male with no significant PMH presents with cold symptoms x2 days, left arch and heel pain x one week, and RLQ abdominal pain that radiates to the right testicle for an undetermined amount of time.   1.Cold- Daughter has been ill with a cold since last week. She has not needed antibiotics and is doing better now. Patient has used Ginger tea to help with symptoms but still has nasal congestion and cough. Denies SOB, chest pain, fever, chills, headache, or rash.  2.Left heel pain- Pain worse with first steps of the day or when standing from a seated or supine position. Has not taken anything for pain. No other pain elsewhere in the LLE. Denies weakness or paresthesia.  3.RLQ pain- pain is felt in the right inguinal at times. Pain radiates to the right testicle. Does not notice testicular swelling or abnormality. Has not taken anything for relief. Urinary function intact. Denies fever, chills, nausea, vomiting, or abdominal pain elsewhere.  Outpatient Medications Prior to Visit  Medication Sig Dispense Refill  . docusate sodium (COLACE) 100 MG capsule Take 1 capsule (100 mg total) by mouth every 12 (twelve) hours. 30 capsule 0  . meloxicam (MOBIC) 7.5 MG tablet Take 1 tablet (7.5 mg total) by mouth daily. (Patient not taking: Reported on 05/14/2016) 30 tablet 1  . ondansetron (ZOFRAN ODT) 4 MG disintegrating tablet Take 1 tablet (4 mg total) by mouth every 8 (eight) hours as needed for nausea or vomiting. (Patient not taking: Reported on 11/10/2015) 20 tablet 0  . oxyCODONE-acetaminophen (PERCOCET/ROXICET) 5-325 MG tablet Take 1-2 tablets by mouth every 6 (six) hours as needed. (Patient not taking: Reported on 11/10/2015) 20 tablet 0  . tamsulosin (FLOMAX) 0.4 MG  CAPS capsule Take 1 capsule (0.4 mg total) by mouth daily. (Patient not taking: Reported on 11/10/2015) 15 capsule 0  . terbinafine (LAMISIL AT) 1 % cream Apply 1 application topically 2 (two) times daily. (Patient not taking: Reported on 05/14/2016) 30 g 1  . ibuprofen (ADVIL,MOTRIN) 800 MG tablet Take 1 tablet (800 mg total) by mouth every 8 (eight) hours as needed for mild pain. (Patient not taking: Reported on 11/10/2015) 30 tablet 0   No facility-administered medications prior to visit.     ROS Review of Systems  Constitutional: Negative for chills, fatigue and fever.  HENT: Positive for rhinorrhea and sinus pressure. Negative for ear pain, sinus pain and sore throat.   Eyes: Negative for pain.  Respiratory: Positive for cough. Negative for shortness of breath.   Cardiovascular: Negative for chest pain.  Gastrointestinal: Negative for abdominal pain.  Genitourinary: Negative for difficulty urinating and dysuria.  Musculoskeletal: Positive for gait problem (left foot pain). Negative for back pain.  Skin: Negative for rash and wound.  Neurological: Positive for headaches (attributed to URI).    Objective:  BP 127/82 (BP Location: Right Arm, Patient Position: Sitting, Cuff Size: Normal)   Pulse 84   Temp 98.4 F (36.9 C) (Oral)   Resp 20   Wt 213 lb 6.4 oz (96.8 kg)   SpO2 97%   BMI 30.40 kg/m   BP/Weight 05/14/2016 [id number]  Systolic BP [id number]  Diastolic BP 82 77 77  Wt. (Lbs)  213.4 205.4 -  BMI 30.4 29.26 32.88      Physical Exam  Constitutional: He is oriented to person, place, and time.  NAD, overweight, polite  HENT:  Head: Normocephalic and atraumatic.  TM normal bilaterally, turbinates hypertrophic and erythematous bilaterally.   Eyes: Scleral icterus is present.  Neck: Normal range of motion. Neck supple.  Cardiovascular: Normal rate, regular rhythm and normal heart sounds.   Pulmonary/Chest: Effort normal and breath sounds normal.  Genitourinary:   Genitourinary Comments: Right sided inguinal hernia noted, does not extend into scrotum. Non-tender, and non-edematous testicles. No Hydrocele, varicocele, or torsion noted.  Musculoskeletal:  Left heel with tenderness to palpation on the medial band of plantar fascia in proximity to the heel. No deformity, erythema, edema, or ecchymosis noted. Achilles and gastrocnemius normal.  Lymphadenopathy:    He has cervical adenopathy (Mild anterior cervical lymphadenopathy on left side).  Neurological: He is alert and oriented to person, place, and time.  Skin: He is not diaphoretic.     Assessment & Plan:   1. Right inguinal pain * I have advised patient that he likely has an inguinal hernia and we will wait to confirm with ultrasound. I have explained the only fix is done with a surgical repair.  - US Scrotum; Future - [place] Art/Ven Flow Abd Pelv Doppler; Future  2. Plantar fasciitis of left foot I have counseled pt on plantar fasciitis insoles and foot sleep support. I personally showed him how to stretch the foot. I have advised to take NSAID as directed.  3. Viral upper respiratory tract infection -Early stage viral infection. I have asked him to monitor symptoms, take medications as directed, and to call if symptoms worsen or persist.  Meds ordered this encounter  Medications  . ibuprofen (ADVIL,MOTRIN) 800 MG tablet    Sig: Take 1 tablet (800 mg total) by mouth every 8 (eight) hours as needed.    Dispense:  30 tablet    Refill:  0    Order Specific Question:   Supervising Provider    Answer:   Tresa Garter [id number]  . Phenylephrine-DM-GG-APAP (MUCINEX FAST-MAX COLD FLU) 5-10-200-325 MG/10ML LIQD    Sig: Take 20 mLs by mouth every 4 (four) hours.    Dispense:  1 Bottle    Refill:  0    Order Specific Question:   Supervising Provider    Answer:   Tresa Garter [id number]  . sodium chloride (OCEAN) 0.65 % SOLN nasal spray    Sig: Place 1 spray into both nostrils as  needed for congestion.    Dispense:  1 Bottle    Refill:  0    Order Specific Question:   Supervising Provider    Answer:   Tresa Garter [id number]    Follow-up: Return in about 4 weeks (around 06/11/2016) for Plantar fasciitis.   Clent Demark PA

## 2016-05-14 NOTE — Telephone Encounter (Signed)
Appointment for US/Doppler is Friday, February 9th @ 11 am. Pt aware.

## 2016-05-14 NOTE — Progress Notes (Signed)
Cough and cold sx's Abd. Pain concerns with hernia Pain in right foot

## 2016-05-14 NOTE — Patient Instructions (Addendum)
Please go to [facility] Radiology Department and schedule your ultrasound. Report to the emergency room if your inguinal pain increases or should your develop any other concerning symptoms. Please go to the pharmacy and buy Plantar Fasciitis insoles and also a Plantar Fasciitis foot sleep support. Perform stretches of the foot as I showed you in clinic.   Upper Respiratory Infection, Adult Most upper respiratory infections (URIs) are a viral infection of the air passages leading to the lungs. A URI affects the nose, throat, and upper air passages. The most common type of URI is nasopharyngitis and is typically referred to as "the common cold." URIs run their course and usually go away on their own. Most of the time, a URI does not require medical attention, but sometimes a bacterial infection in the upper airways can follow a viral infection. This is called a secondary infection. Sinus and middle ear infections are common types of secondary upper respiratory infections. Bacterial pneumonia can also complicate a URI. A URI can worsen asthma and chronic obstructive pulmonary disease (COPD). Sometimes, these complications can require emergency medical care and may be life threatening. What are the causes? Almost all URIs are caused by viruses. A virus is a type of germ and can spread from one person to another. What increases the risk? You may be at risk for a URI if:  You smoke.  You have chronic heart or lung disease.  You have a weakened defense (immune) system.  You are very young or very old.  You have nasal allergies or asthma.  You work in crowded or poorly ventilated areas.  You work in health care facilities or schools. What are the signs or symptoms? Symptoms typically develop 2-3 days after you come in contact with a cold virus. Most viral URIs last 7-10 days. However, viral URIs from the influenza virus (flu virus) can last 14-18 days and are typically more severe. Symptoms may  include:  Runny or stuffy (congested) nose.  Sneezing.  Cough.  Sore throat.  Headache.  Fatigue.  Fever.  Loss of appetite.  Pain in your forehead, behind your eyes, and over your cheekbones (sinus pain).  Muscle aches. How is this diagnosed? Your health care provider may diagnose a URI by:  Physical exam.  Tests to check that your symptoms are not due to another condition such as:  Strep throat.  Sinusitis.  Pneumonia.  Asthma. How is this treated? A URI goes away on its own with time. It cannot be cured with medicines, but medicines may be prescribed or recommended to relieve symptoms. Medicines may help:  Reduce your fever.  Reduce your cough.  Relieve nasal congestion. Follow these instructions at home:  Take medicines only as directed by your health care provider.  Gargle warm saltwater or take cough drops to comfort your throat as directed by your health care provider.  Use a warm mist humidifier or inhale steam from a shower to increase air moisture. This may make it easier to breathe.  Drink enough fluid to keep your urine clear or pale yellow.  Eat soups and other clear broths and maintain good nutrition.  Rest as needed.  Return to work when your temperature has returned to normal or as your health care provider advises. You may need to stay home longer to avoid infecting others. You can also use a face mask and careful hand washing to prevent spread of the virus.  Increase the usage of your inhaler if you have asthma.  Do not use any tobacco products, including cigarettes, chewing tobacco, or electronic cigarettes. If you need help quitting, ask your health care provider. How is this prevented? The best way to protect yourself from getting a cold is to practice good hygiene.  Avoid oral or hand contact with people with cold symptoms.  Wash your hands often if contact occurs. There is no clear evidence that vitamin C, vitamin E,  echinacea, or exercise reduces the chance of developing a cold. However, it is always recommended to get plenty of rest, exercise, and practice good nutrition. Contact a health care provider if:  You are getting worse rather than better.  Your symptoms are not controlled by medicine.  You have chills.  You have worsening shortness of breath.  You have brown or red mucus.  You have yellow or brown nasal discharge.  You have pain in your face, especially when you bend forward.  You have a fever.  You have swollen neck glands.  You have pain while swallowing.  You have white areas in the back of your throat. Get help right away if:  You have severe or persistent:  Headache.  Ear pain.  Sinus pain.  Chest pain.  You have chronic lung disease and any of the following:  Wheezing.  Prolonged cough.  Coughing up blood.  A change in your usual mucus.  You have a stiff neck.  You have changes in your:  Vision.  Hearing.  Thinking.  Mood. This information is not intended to replace advice given to you by your health care provider. Make sure you discuss any questions you have with your health care provider. Document Released: 09/22/2000 Document Revised: 11/30/2015 Document Reviewed: 07/04/2013 Elsevier Interactive Patient Education  2017 [employer]. Plantar Fasciitis Plantar fasciitis is a painful foot condition that affects the heel. It occurs when the band of tissue that connects the toes to the heel bone (plantar fascia) becomes irritated. This can happen after exercising too much or doing other repetitive activities (overuse injury). The pain from plantar fasciitis can range from mild irritation to severe pain that makes it difficult for you to walk or move. The pain is usually worse in the morning or after you have been sitting or lying down for a while. CAUSES This condition may be caused by:  Standing for long periods of time.  Wearing shoes that do  not fit.  Doing high-impact activities, including running, aerobics, and ballet.  Being overweight.  Having an abnormal way of walking (gait).  Having tight calf muscles.  Having high arches in your feet.  Starting a new athletic activity. SYMPTOMS The main symptom of this condition is heel pain. Other symptoms include:  Pain that gets worse after activity or exercise.  Pain that is worse in the morning or after resting.  Pain that goes away after you walk for a few minutes. DIAGNOSIS This condition may be diagnosed based on your signs and symptoms. Your health care provider will also do a physical exam to check for:  A tender area on the bottom of your foot.  A high arch in your foot.  Pain when you move your foot.  Difficulty moving your foot. You may also need to have imaging studies to confirm the diagnosis. These can include:  X-rays.  Ultrasound.  MRI. TREATMENT  Treatment for plantar fasciitis depends on the severity of the condition. Your treatment may include:  Rest, ice, and over-the-counter pain medicines to manage your pain.  Exercises to  stretch your calves and your plantar fascia.  A splint that holds your foot in a stretched, upward position while you sleep (night splint).  Physical therapy to relieve symptoms and prevent problems in the future.  Cortisone injections to relieve severe pain.  Extracorporeal shock wave therapy (ESWT) to stimulate damaged plantar fascia with electrical impulses. It is often used as a last resort before surgery.  Surgery, if other treatments have not worked after 12 months. HOME CARE INSTRUCTIONS  Take medicines only as directed by your health care provider.  Avoid activities that cause pain.  Roll the bottom of your foot over a bag of ice or a bottle of cold water. Do this for 20 minutes, 3-4 times a day.  Perform simple stretches as directed by your health care provider.  Try wearing athletic shoes with  air-sole or gel-sole cushions or soft shoe inserts.  Wear a night splint while sleeping, if directed by your health care provider.  Keep all follow-up appointments with your health care provider. PREVENTION   Do not perform exercises or activities that cause heel pain.  Consider finding low-impact activities if you continue to have problems.  Lose weight if you need to. The best way to prevent plantar fasciitis is to avoid the activities that aggravate your plantar fascia. SEEK MEDICAL CARE IF:  Your symptoms do not go away after treatment with home care measures.  Your pain gets worse.  Your pain affects your ability to move or do your daily activities. This information is not intended to replace advice given to you by your health care provider. Make sure you discuss any questions you have with your health care provider. Document Released: 12/22/2000 Document Revised: 07/21/2015 Document Reviewed: 02/06/2014 Elsevier Interactive Patient Education  2017 [employer] Inc. Inguinal Hernia, Adult Introduction An inguinal hernia is when fat or the intestines push through the area where the leg meets the lower abdomen (groin) and create a rounded lump (bulge). This condition develops over time. There are three types of inguinal hernias. These types include:  Hernias that can be pushed back into the belly (are reducible).  Hernias that are not reducible (are incarcerated).  Hernias that are not reducible and lose their blood supply (are strangulated). This type of hernia requires emergency surgery. What are the causes? This condition is caused by having a weak spot in the muscles or tissue. This weakness lets the hernia poke through. This condition can be triggered by:  Suddenly straining the muscles of the lower abdomen.  Lifting heavy objects.  Straining to have a bowel movement. Difficult bowel movements (constipation) can lead to this.  Coughing. What increases the risk? This  condition is more likely to develop in:  Men.  Pregnant women.  People who:  Are overweight.  Work in jobs that require long periods of standing or heavy lifting.  Have had an inguinal hernia before.  Smoke or have lung disease. These factors can lead to long-lasting (chronic) coughing. What are the signs or symptoms? Symptoms can depend on the size of the hernia. Often, a small inguinal hernia has no symptoms. Symptoms of a larger hernia include:  A lump in the groin. This is easier to see when the person is standing. It might not be visible when he or she is lying down.  Pain or burning in the groin. This occurs especially when lifting, straining, or coughing.  A dull ache or a feeling of pressure in the groin.  A lump in the scrotum in  men. Symptoms of a strangulated inguinal hernia can include:  A bulge in the groin that is very painful and tender to the touch.  A bulge that turns red or purple.  Fever, nausea, and vomiting.  The inability to have a bowel movement or to pass gas. How is this diagnosed? This condition is diagnosed with a medical history and physical exam. Your health care provider may feel your groin area and ask you to cough. How is this treated? Treatment for this condition varies depending on the size of your hernia and whether you have symptoms. If you do not have symptoms, your health care provider may have you watch your hernia carefully and come in for follow-up visits. If your hernia is larger or if you have symptoms, your treatment will include surgery. Follow these instructions at home: Lifestyle  Drink enough fluid to keep your urine clear or pale yellow.  Eat a diet that includes a lot of fiber. Eat plenty of fruits, vegetables, and whole grains. Talk with your health care provider if you have questions.  Avoid lifting heavy objects.  Avoid standing for long periods of time.  Do not use tobacco products, including cigarettes, chewing  tobacco, or e-cigarettes. If you need help quitting, ask your health care provider.  Maintain a healthy weight. General instructions  Do not try to force the hernia back in.  Watch your hernia for any changes in color or size. Let your health care provider know if any changes occur.  Take over-the-counter and prescription medicines only as told by your health care provider.  Keep all follow-up visits as told by your health care provider. This is important. Contact a health care provider if:  You have a fever.  You have new symptoms.  Your symptoms get worse. Get help right away if:  You have pain in the groin that suddenly gets worse.  A bulge in the groin gets bigger suddenly and does not go down.  You are a man and you have a sudden pain in the scrotum, or the size of your scrotum suddenly changes.  A bulge in the groin area becomes red or purple and is painful to the touch.  You have nausea or vomiting that does not go away.  You feel your heart beating a lot more quickly than normal.  You cannot have a bowel movement or pass gas. This information is not intended to replace advice given to you by your health care provider. Make sure you discuss any questions you have with your health care provider. Document Released: 08/15/2008 Document Revised: 09/04/2015 Document Reviewed: 02/06/2014  2017 Elsevier

## 2016-05-20 ENCOUNTER — Ambulatory Visit (HOSPITAL_COMMUNITY): Payer: Self-pay

## 2016-05-21 ENCOUNTER — Other Ambulatory Visit: Payer: Self-pay | Admitting: Physician Assistant

## 2016-05-21 ENCOUNTER — Ambulatory Visit (HOSPITAL_COMMUNITY)
Admission: RE | Admit: 2016-05-21 | Discharge: 2016-05-21 | Disposition: A | Discharge status: Home / Self Care | Payer: Self-pay | Source: Ambulatory Visit | Attending: Physician Assistant | Admitting: Physician Assistant

## 2016-05-21 DIAGNOSIS — R1031 Right lower quadrant pain: Secondary | ICD-10-CM

## 2016-05-21 DIAGNOSIS — K4090 Unilateral inguinal hernia, without obstruction or gangrene, not specified as recurrent: Secondary | ICD-10-CM | POA: Insufficient documentation

## 2016-05-21 NOTE — Progress Notes (Signed)
I have called twice. First time son answered and asked me to call back in 5 minutes. Second time I left a message. Please inform Philip Young that he should see a general surgeon about his hernia if he wants it repaired. He can come to clinic if he wants further explanation.

## 2016-05-25 ENCOUNTER — Telehealth: Payer: Self-pay | Admitting: Family Medicine

## 2016-05-25 NOTE — Telephone Encounter (Signed)
Good Afternoon  In order for me to refer the patient he need to renew her CAFA his Exp 12/27/2015 and the referral from the provider  Thank You

## 2016-05-25 NOTE — Telephone Encounter (Signed)
Patient called requesting results of an ultrasound that was done. States that he will stop in this morning to ask nurse about results.Marland KitchenMarland Kitchen

## 2016-05-25 NOTE — Telephone Encounter (Signed)
Pt aware of results for doppler. He is agreeable to see a Psychologist, sport and exercise.

## 2016-05-26 ENCOUNTER — Other Ambulatory Visit: Payer: Self-pay | Admitting: Physician Assistant

## 2016-05-26 DIAGNOSIS — R05 Cough: Secondary | ICD-10-CM

## 2016-05-26 DIAGNOSIS — R059 Cough, unspecified: Secondary | ICD-10-CM

## 2016-05-26 MED ORDER — AMOXICILLIN-POT CLAVULANATE 875-125 MG PO TABS: 1.0000 | ORAL_TABLET | Freq: Two times a day (BID) | ORAL | 0 refills | Status: AC

## 2016-05-26 MED ORDER — BENZONATATE 100 MG PO CAPS: 100.0000 mg | ORAL_CAPSULE | Freq: Two times a day (BID) | ORAL | 0 refills | Status: DC | PRN

## 2016-05-26 MED FILL — BENZONATATE 100 MG CAPSULE: 100 | 10 days supply | Qty: 20 | Fill #0

## 2016-05-26 MED FILL — AMOX-CLAV 875-125 MG TABLET: 875-125 | 10 days supply | Qty: 20 | Fill #0

## 2016-05-26 NOTE — Progress Notes (Signed)
Continues with cough. URI diagnosed on 05/17/16.

## 2016-05-26 NOTE — Telephone Encounter (Signed)
Patient did bring paperwork, but it was incomplete.  I need three bank statements from him before I can submit for review.  I left him a message today and I also mailed him a letter yesterday requesting the information.

## 2016-05-26 NOTE — Telephone Encounter (Signed)
Please inform him to renew his CAFA didn't notify the clinic once this has been done so I can place his referral

## 2016-05-26 NOTE — Telephone Encounter (Signed)
I have called patient and he states he brought in all his paperwork for CAFA "10 days ago". Please let me know if there is something in his paperwork that is missing.

## 2016-05-26 NOTE — Telephone Encounter (Signed)
I'm waiting for Philip Young response to let me know if the [facility] is process thank you  I will let you know

## 2016-10-07 ENCOUNTER — Ambulatory Visit: Payer: Self-pay

## 2016-12-28 ENCOUNTER — Ambulatory Visit: Payer: Self-pay | Attending: Family Medicine

## 2017-03-23 ENCOUNTER — Ambulatory Visit: Payer: Self-pay | Admitting: Family Medicine

## 2017-03-28 ENCOUNTER — Ambulatory Visit (HOSPITAL_COMMUNITY)
Admission: RE | Admit: 2017-03-28 | Discharge: 2017-03-28 | Disposition: A | Discharge status: Home / Self Care | Payer: Self-pay | Source: Ambulatory Visit | Attending: Physician Assistant | Admitting: Physician Assistant

## 2017-03-28 ENCOUNTER — Ambulatory Visit: Payer: Self-pay | Attending: Family Medicine | Admitting: Physician Assistant

## 2017-03-28 ENCOUNTER — Telehealth: Payer: Self-pay | Admitting: Family Medicine

## 2017-03-28 VITALS — BP 129/82 | HR 70 | Temp 97.6°F | Resp 16 | Ht 69.0 in | Wt 207.8 lb

## 2017-03-28 DIAGNOSIS — M25512 Pain in left shoulder: Secondary | ICD-10-CM | POA: Insufficient documentation

## 2017-03-28 DIAGNOSIS — G8929 Other chronic pain: Secondary | ICD-10-CM | POA: Insufficient documentation

## 2017-03-28 DIAGNOSIS — N489 Disorder of penis, unspecified: Secondary | ICD-10-CM

## 2017-03-28 DIAGNOSIS — K649 Unspecified hemorrhoids: Secondary | ICD-10-CM | POA: Insufficient documentation

## 2017-03-28 DIAGNOSIS — K5900 Constipation, unspecified: Secondary | ICD-10-CM | POA: Insufficient documentation

## 2017-03-28 MED ORDER — PRAMOXINE HCL 1 % RE FOAM: 1.0000 "application " | Freq: Three times a day (TID) | RECTAL | 0 refills | Status: DC | PRN

## 2017-03-28 MED ORDER — MELOXICAM 7.5 MG PO TABS: 7.5000 mg | ORAL_TABLET | Freq: Every day | ORAL | 1 refills | Status: DC

## 2017-03-28 NOTE — Patient Instructions (Signed)
Hemorrhoids Hemorrhoids are swollen veins in and around the rectum or anus. There are two types of hemorrhoids:  Internal hemorrhoids. These occur in the veins that are just inside the rectum. They may poke through to the outside and become irritated and painful.  External hemorrhoids. These occur in the veins that are outside of the anus and can be felt as a painful swelling or hard lump near the anus.  Most hemorrhoids do not cause serious problems, and they can be managed with home treatments such as diet and lifestyle changes. If home treatments do not help your symptoms, procedures can be done to shrink or remove the hemorrhoids. What are the causes? This condition is caused by increased pressure in the anal area. This pressure may result from various things, including:  Constipation.  Straining to have a bowel movement.  Diarrhea.  Pregnancy.  Obesity.  Sitting for long periods of time.  Heavy lifting or other activity that causes you to strain.  Anal sex.  What are the signs or symptoms? Symptoms of this condition include:  Pain.  Anal itching or irritation.  Rectal bleeding.  Leakage of stool (feces).  Anal swelling.  One or more lumps around the anus.  How is this diagnosed? This condition can often be diagnosed through a visual exam. Other exams or tests may also be done, such as:  Examination of the rectal area with a gloved hand (digital rectal exam).  Examination of the anal canal using a small tube (anoscope).  A blood test, if you have lost a significant amount of blood.  A test to look inside the colon (sigmoidoscopy or colonoscopy).  How is this treated? This condition can usually be treated at home. However, various procedures may be done if dietary changes, lifestyle changes, and other home treatments do not help your symptoms. These procedures can help make the hemorrhoids smaller or remove them completely. Some of these procedures involve  surgery, and others do not. Common procedures include:  Rubber band ligation. Rubber bands are placed at the base of the hemorrhoids to cut off the blood supply to them.  Sclerotherapy. Medicine is injected into the hemorrhoids to shrink them.  Infrared coagulation. A type of light energy is used to get rid of the hemorrhoids.  Hemorrhoidectomy surgery. The hemorrhoids are surgically removed, and the veins that supply them are tied off.  Stapled hemorrhoidopexy surgery. A circular stapling device is used to remove the hemorrhoids and use staples to cut off the blood supply to them.  Follow these instructions at home: Eating and drinking  Eat foods that have a lot of fiber in them, such as whole grains, beans, nuts, fruits, and vegetables. Ask your health care provider about taking products that have added fiber (fiber supplements).  Drink enough fluid to keep your urine clear or pale yellow. Managing pain and swelling  Take warm sitz baths for 20 minutes, 3-4 times a day to ease pain and discomfort.  If directed, apply ice to the affected area. Using ice packs between sitz baths may be helpful. ? Put ice in a plastic bag. ? Place a towel between your skin and the bag. ? Leave the ice on for 20 minutes, 2-3 times a day. General instructions  Take over-the-counter and prescription medicines only as told by your health care provider.  Use medicated creams or suppositories as told.  Exercise regularly.  Go to the bathroom when you have the urge to have a bowel movement. Do not wait.    Avoid straining to have bowel movements.  Keep the anal area dry and clean. Use wet toilet paper or moist towelettes after a bowel movement.  Do not sit on the toilet for long periods of time. This increases blood pooling and pain. Contact a health care provider if:  You have increasing pain and swelling that are not controlled by treatment or medicine.  You have uncontrolled bleeding.  You  have difficulty having a bowel movement, or you are unable to have a bowel movement.  You have pain or inflammation outside the area of the hemorrhoids. This information is not intended to replace advice given to you by your health care provider. Make sure you discuss any questions you have with your health care provider. Document Released: 03/26/2000 Document Revised: 08/27/2015 Document Reviewed: 12/11/2014 Elsevier Interactive Patient Education  2017 Elsevier Inc.  

## 2017-03-28 NOTE — Progress Notes (Signed)
Patient ID: Philip Young, male   DOB: [birth year], 46 y.o.   MRN: [id number]   Ryun Velez, is a 46 y.o. male  LKG:[id number]  GUY:[id number]  DOB - 08-23-1970  Subjective:  Chief Complaint and HPI: Philip Young is a 46 y.o. male here today for multiple issues. 1) L shoulder pain and stiffness with limited ROM for about 3 months.  NKI.  Also c/o neck str=iffness and muscle tightness.  He is R hand dominant.  He denies CP, SOB.  No paresthesias or weakness.  Hasn't taken anything for pain.    2)"yellow spot" on tip of penis that comes and goes.  Isnt here now.  Wants to see a specialist.  He has been prescribed cream for this and it goes away then comes back.  He is unsure of the kind of cream.    3)on and off problems with constipation and hemorrhoids.  No active bleeding or problem right now.  Minimal blood on TP at times.  Occasional pain with defecation.  No abdominal pain.  No melena.  No weight loss or change in appetite.  No FH colon CA. Requesting proctofoam by name-unconcerned with cost.     ROS:   Constitutional:  No f/c, No night sweats, No unexplained weight loss. EENT:  No vision changes, No blurry vision, No hearing changes. No mouth, throat, or ear problems.  Respiratory: No cough, No SOB Cardiac: No CP, no palpitations GI:  No abd pain, No N/V/D. GU: No Urinary s/sx Musculoskeletal: L shoulder pain Neuro: No headache, no dizziness, no motor weakness.  Skin: No rash Endocrine:  No polydipsia. No polyuria.  Psych: Denies SI/HI  No problems updated.  ALLERGIES: No Known Allergies  PAST MEDICAL HISTORY: History reviewed. No pertinent past medical history.  MEDICATIONS AT HOME: Prior to Admission medications   Medication Sig Start Date End Date Taking? Authorizing Provider  benzonatate (TESSALON) 100 MG capsule Take 1 capsule (100 mg total) by mouth 2 (two) times daily as needed for cough. Patient not taking: Reported on 03/28/2017 05/26/16   Clent Demark, PA-C  docusate sodium (COLACE) 100 MG capsule Take 1 capsule (100 mg total) by mouth every 12 (twelve) hours. Patient not taking: Reported on 03/28/2017 01/04/14   Harden Mo, MD  meloxicam (MOBIC) 7.5 MG tablet Take 1 tablet (7.5 mg total) by mouth daily. 03/28/17   Argentina Donovan, PA-C  pramoxine (PROCTOFOAM) 1 % foam Place 1 application rectally 3 (three) times daily as needed for anal itching. 03/28/17   Argentina Donovan, PA-C  sodium chloride (OCEAN) 0.65 % SOLN nasal spray Place 1 spray into both nostrils as needed for congestion. 05/14/16   Clent Demark, PA-C     Objective:  EXAM:   Vitals:   03/28/17 0840  BP: 129/82  Pulse: 70  Resp: 16  Temp: 97.6 F (36.4 C)  TempSrc: Oral  SpO2: 97%  Weight: 207 lb 12.8 oz (94.3 kg)  Height: 5\' 9"  (1.753 m)    General appearance : A&OX3. NAD. Non-toxic-appearing HEENT: Atraumatic and Normocephalic.  PERRLA. EOM intact.   Neck: supple, no JVD. No cervical lymphadenopathy. No thyromegaly Chest/Lungs:  Breathing-non-labored, Good air entry bilaterally, breath sounds normal without rales, rhonchi, or wheezing  CVS: S1 S2 regular, no murmurs, gallops, rubs  Extremities: L shoulder:  Skin is warm, dry, and intact.  No point TTP.  Decreased ROM with lateral abduction.  Internal rotation is WNL.  External rotation about 50% of normal.  +  speed's test.  Bilateral Lower Ext shows no edema, both legs are warm to touch with = pulse throughout Neurology:  CN II-XII grossly intact, Non focal.   Psych:  TP linear. J/I WNL. Normal speech. Appropriate eye contact and affect.  Skin:  No Rash  Data Review Lab Results  Component Value Date   HGBA1C 5.9 (H) 07/30/2014     Assessment & Plan   1. Hemorrhoids, unspecified hemorrhoid type No rectal exam today - pramoxine (PROCTOFOAM) 1 % foam; Place 1 application rectally 3 (three) times daily as needed for anal itching.  Dispense: 15 g; Refill: 0 - Ambulatory referral to General  Surgery  2. Penile lesion Not examined because patient said he would rather wait and have urology examine - Ambulatory referral to Urology  3. Chronic left shoulder pain - DG Shoulder Left; Future - meloxicam (MOBIC) 7.5 MG tablet; Take 1 tablet (7.5 mg total) by mouth daily.  Dispense: 30 tablet; Refill: 1 - Ambulatory referral to Orthopedic Surgery  4. Constipation, unspecified constipation type constipation info given, drink adequate water, fiber, healthy diet, and active lifestyle.    Patient have been counseled extensively about nutrition and exercise  Return in about 2 months (around 05/29/2017) for CPE with Dr Newlin([place]).  The patient was given clear instructions to go to ER or return to medical center if symptoms don't improve, worsen or new problems develop. The patient verbalized understanding. The patient was told to call to get lab results if they haven't heard anything in the next week.     Freeman Caldron, PA-C [facility] [place], [place]   03/28/2017, 9:03 AM

## 2017-03-28 NOTE — Progress Notes (Signed)
Concerns with hemorrhoids Denies bleeding

## 2017-03-28 NOTE — Telephone Encounter (Signed)
Pt came in says he has the OC but needs a call back to know the percentage of his coverage. Please follow up

## 2017-03-31 ENCOUNTER — Ambulatory Visit: Payer: Self-pay

## 2017-04-01 ENCOUNTER — Other Ambulatory Visit: Payer: Self-pay | Admitting: Pharmacist

## 2017-04-01 NOTE — Telephone Encounter (Signed)
Received fax from pharmacy that pramoxine is not available. Faxed back that patient should be able to get Proctofoam OTC without prescription.

## 2017-04-06 NOTE — Telephone Encounter (Signed)
The hospital requested additional bank statements; relayed that message to the patient.  He stated he would bring in the three most recent bank statements.

## 2017-08-16 ENCOUNTER — Telehealth: Payer: Self-pay | Admitting: Family Medicine

## 2017-08-16 NOTE — Telephone Encounter (Signed)
Patient called and requested to speak with referral cord. Regarding referrals. please fu at your earliest convenience.

## 2017-08-16 NOTE — Telephone Encounter (Signed)
I spoke to patient and he just wanted to check when is his appointment for his physical an I told him  Thank you

## 2017-09-06 ENCOUNTER — Encounter: Payer: Self-pay | Admitting: Family Medicine

## 2017-09-06 ENCOUNTER — Ambulatory Visit: Payer: Medicaid Other | Attending: Family Medicine | Admitting: Family Medicine

## 2017-09-06 VITALS — BP 113/68 | HR 68 | Temp 97.7°F | Ht 69.0 in | Wt 205.6 lb

## 2017-09-06 DIAGNOSIS — Z23 Encounter for immunization: Secondary | ICD-10-CM

## 2017-09-06 DIAGNOSIS — Z0000 Encounter for general adult medical examination without abnormal findings: Secondary | ICD-10-CM

## 2017-09-06 DIAGNOSIS — Z0001 Encounter for general adult medical examination with abnormal findings: Secondary | ICD-10-CM | POA: Diagnosis not present

## 2017-09-06 DIAGNOSIS — G8929 Other chronic pain: Secondary | ICD-10-CM | POA: Diagnosis not present

## 2017-09-06 DIAGNOSIS — M25512 Pain in left shoulder: Secondary | ICD-10-CM | POA: Insufficient documentation

## 2017-09-06 DIAGNOSIS — H527 Unspecified disorder of refraction: Secondary | ICD-10-CM

## 2017-09-06 DIAGNOSIS — M7542 Impingement syndrome of left shoulder: Secondary | ICD-10-CM | POA: Diagnosis not present

## 2017-09-06 DIAGNOSIS — K649 Unspecified hemorrhoids: Secondary | ICD-10-CM | POA: Insufficient documentation

## 2017-09-06 DIAGNOSIS — K4090 Unilateral inguinal hernia, without obstruction or gangrene, not specified as recurrent: Secondary | ICD-10-CM | POA: Insufficient documentation

## 2017-09-06 DIAGNOSIS — F17200 Nicotine dependence, unspecified, uncomplicated: Secondary | ICD-10-CM | POA: Insufficient documentation

## 2017-09-06 DIAGNOSIS — A630 Anogenital (venereal) warts: Secondary | ICD-10-CM | POA: Diagnosis not present

## 2017-09-06 DIAGNOSIS — Z720 Tobacco use: Secondary | ICD-10-CM | POA: Insufficient documentation

## 2017-09-06 MED ORDER — PRAMOXINE HCL 1 % RE FOAM: 1.0000 "application " | Freq: Three times a day (TID) | RECTAL | 1 refills | Status: DC | PRN

## 2017-09-06 MED ORDER — MELOXICAM 7.5 MG PO TABS: 7.5000 mg | ORAL_TABLET | Freq: Every day | ORAL | 1 refills | Status: DC

## 2017-09-06 MED ORDER — IMIQUIMOD 5 % EX CREA: TOPICAL_CREAM | CUTANEOUS | 1 refills | Status: DC

## 2017-09-06 NOTE — Patient Instructions (Signed)
Genital Warts Genital warts are small growths in the genital area or anal area. They are caused by a type of germ (HPV virus). This germ is spread from person to person during sex. It can be spread through vaginal, anal, and oral sex. Genital warts can lead to other problems if they are not treated. Follow these instructions at home: Medicines  Apply over-the-counter and prescription medicines only as told by your doctor.  Do not use medicines that are meant for treating hand warts.  Talk with your doctor about using anti-itch creams. General instructions  Do not touch or scratch the warts.  Do not have sex until your treatment is done.  Tell your current and past sexual partners about your condition. They may need treatment.  Keep all follow-up visits as told by your doctor. This is important.  After treatment, use condoms during sex. Other Instructions for Women  Women who have genital warts may need to be checked more often for cervical cancer.  If you become pregnant, tell your doctor that you have had genital warts. The germ can be passed to the baby. Contact a doctor if:  You have redness, swelling, or pain in the area of the treated skin.  You have a fever.  You feel generally sick.  You feel lumps in and around your genital area or anal area.  You have bleeding in your genital area or anal area.  You have pain during sex. This information is not intended to replace advice given to you by your health care provider. Make sure you discuss any questions you have with your health care provider. Document Released: 06/23/2009 Document Revised: 09/04/2015 Document Reviewed: 06/24/2014 Elsevier Interactive Patient Education  2018 Elsevier Inc.  

## 2017-09-06 NOTE — Progress Notes (Signed)
Subjective:  Patient ID: Philip Young, male    DOB: 1971-03-15  Age: 47 y.o. MRN: [id number]  CC: Annual Exam   HPI [name] presents for a complete physical exam. He would like a referral to ophthalmology due to refractive errors and difficulty reading. He has also has some penile lesions for the last 1 year which is sometimes pruritic but not painful. He also has a 66-monthhistory of left shoulder pain which he states radiates from his neck down to his shoulder and upper left arm. Also requests a refill of his hemorrhoid cream.    Past Surgical History:  Procedure Laterality Date  . history of nose surgery       No Known Allergies   Outpatient Medications Prior to Visit  Medication Sig Dispense Refill  . benzonatate (TESSALON) 100 MG capsule Take 1 capsule (100 mg total) by mouth 2 (two) times daily as needed for cough. (Patient not taking: Reported on 03/28/2017) 20 capsule 0  . docusate sodium (COLACE) 100 MG capsule Take 1 capsule (100 mg total) by mouth every 12 (twelve) hours. (Patient not taking: Reported on 03/28/2017) 30 capsule 0  . sodium chloride (OCEAN) 0.65 % SOLN nasal spray Place 1 spray into both nostrils as needed for congestion. (Patient not taking: Reported on 09/06/2017) 1 Bottle 0  . meloxicam (MOBIC) 7.5 MG tablet Take 1 tablet (7.5 mg total) by mouth daily. (Patient not taking: Reported on 09/06/2017) 30 tablet 1  . pramoxine (PROCTOFOAM) 1 % foam Place 1 application rectally 3 (three) times daily as needed for anal itching. (Patient not taking: Reported on 09/06/2017) 15 g 0   No facility-administered medications prior to visit.     ROS Review of Systems  Constitutional: Negative for activity change and appetite change.  HENT: Negative for sinus pressure and sore throat.   Eyes: Negative for visual disturbance.  Respiratory: Negative for cough, chest tightness and shortness of breath.   Cardiovascular: Negative for chest pain and leg  swelling.  Gastrointestinal: Negative for abdominal distention, abdominal pain, constipation and diarrhea.  Endocrine: Negative.   Genitourinary: Negative for dysuria.  Musculoskeletal: Negative for joint swelling and myalgias.       Left shoulder pain  Skin: Positive for rash.  Allergic/Immunologic: Negative.   Neurological: Negative for weakness, light-headedness and numbness.  Psychiatric/Behavioral: Negative for dysphoric mood and suicidal ideas.    Objective:  BP 113/68   Pulse 68   Temp 97.7 F (36.5 C) (Oral)   Ht _0  (1.753 m)   Wt 205 lb 9.6 oz (93.3 kg)   SpO2 98%   BMI 30.36 kg/m   BP/Weight 09/06/2017 176/72/094720/12/6281 Systolic BP [id number] Diastolic BP 68 82 82  Wt. (Lbs) 205.6 207.8 213.4  BMI 30.36 30.69 30.4      Physical Exam  Constitutional: He is oriented to person, place, and time. He appears well-developed and well-nourished.  Cardiovascular: Normal rate, normal heart sounds and intact distal pulses.  No murmur heard. Pulmonary/Chest: Effort normal and breath sounds normal. He has no wheezes. He has no rales. He exhibits no tenderness.  Abdominal: Soft. Bowel sounds are normal. He exhibits no distension and no mass. There is no tenderness.  Genitourinary:  Genitourinary Comments: Right reducible inguinal hernia. Genital warts superior to penis anteriorly. Pinpoint erythematous penile lesion, not tender Normal scrotum  Musculoskeletal:  Positive Neer Hawkins sign of the left shoulder  Neurological: He is alert and oriented to person, place, and time.  Skin: Skin is warm and dry.  Psychiatric: He has a normal mood and affect.     Assessment & Plan:   1. Annual physical exam Counseled on 150 minutes of exercise per week, healthy eating (including decreased daily intake of saturated fats, cholesterol, added sugars, sodium), STI prevention, routine healthcare maintenance. - HIV antibody (with reflex) - CMP14+EGFR - Lipid panel - Tdap  vaccine greater than or equal to 7yo IM  2. Hemorrhoids, unspecified hemorrhoid type - pramoxine (PROCTOFOAM) 1 % foam; Place 1 application rectally 3 (three) times daily as needed for anal itching.  Dispense: 15 g; Refill: 1  3. Genital warts - imiquimod (ALDARA) 5 % cream; Apply topically 3 (three) times a week.  Dispense: 12 each; Refill: 1  4. Right inguinal hernia - Ambulatory referral to General Surgery  5. Impingement syndrome of left shoulder Placed on meloxicam  6. Tobacco abuse Counseled on smoking cessation  7. Chronic left shoulder pain - meloxicam (MOBIC) 7.5 MG tablet; Take 1 tablet (7.5 mg total) by mouth daily.  Dispense: 30 tablet; Refill: 1   Meds ordered this encounter  Medications  . pramoxine (PROCTOFOAM) 1 % foam    Sig: Place 1 application rectally 3 (three) times daily as needed for anal itching.    Dispense:  15 g    Refill:  1  . imiquimod (ALDARA) 5 % cream    Sig: Apply topically 3 (three) times a week.    Dispense:  12 each    Refill:  1  . meloxicam (MOBIC) 7.5 MG tablet    Sig: Take 1 tablet (7.5 mg total) by mouth daily.    Dispense:  30 tablet    Refill:  1    Follow-up: Return in about 3 months (around 12/07/2017) for Follow-up of left shoulder pain and warts.   Charlott Rakes MD

## 2017-09-07 LAB — LIPID PANEL
Chol/HDL Ratio: 8.2 ratio — ABNORMAL HIGH (ref 0.0–5.0)
Cholesterol, Total: 163 mg/dL (ref 100–199)
HDL: 20 mg/dL — AB (ref >39)
LDL Calculated: 76 mg/dL (ref 0–99)
Triglycerides: 333 mg/dL — ABNORMAL HIGH (ref 0–149)
VLDL CHOLESTEROL CAL: 67 mg/dL — AB (ref 5–40)

## 2017-09-07 LAB — CMP14+EGFR
ALT: 36 IU/L (ref 0–44)
AST: 22 IU/L (ref 0–40)
Albumin/Globulin Ratio: 1.7 (ref 1.2–2.2)
Albumin: 4.3 g/dL (ref 3.5–5.5)
Alkaline Phosphatase: 68 IU/L (ref 39–117)
BILIRUBIN TOTAL: 0.2 mg/dL (ref 0.0–1.2)
BUN/Creatinine Ratio: 16 (ref 9–20)
BUN: 12 mg/dL (ref 6–24)
CHLORIDE: 105 mmol/L (ref 96–106)
CO2: 21 mmol/L (ref 20–29)
Calcium: 9.1 mg/dL (ref 8.7–10.2)
Creatinine, Ser: 0.75 mg/dL — ABNORMAL LOW (ref 0.76–1.27)
GFR calc non Af Amer: 110 mL/min/{1.73_m2} (ref >59)
GFR, EST AFRICAN AMERICAN: 127 mL/min/{1.73_m2} (ref >59)
GLUCOSE: 123 mg/dL — AB (ref 65–99)
Globulin, Total: 2.6 g/dL (ref 1.5–4.5)
Potassium: 4.2 mmol/L (ref 3.5–5.2)
Sodium: 139 mmol/L (ref 134–144)
TOTAL PROTEIN: 6.9 g/dL (ref 6.0–8.5)

## 2017-09-07 LAB — HIV ANTIBODY (ROUTINE TESTING W REFLEX): HIV SCREEN 4TH GENERATION: NONREACTIVE

## 2017-09-09 ENCOUNTER — Telehealth: Payer: Self-pay

## 2017-09-09 NOTE — Telephone Encounter (Signed)
Patient was called and informed to contact office for lab results. 

## 2017-09-18 ENCOUNTER — Other Ambulatory Visit: Payer: Self-pay | Admitting: Family Medicine

## 2017-09-18 DIAGNOSIS — K649 Unspecified hemorrhoids: Secondary | ICD-10-CM

## 2017-11-03 NOTE — Patient Instructions (Addendum)
[place]  11/03/2017   Your procedure is scheduled on: 11-11-17   Report to [facility] Main  Entrance    Report to Admitting at 7:30 AM    Call this number if you have problems the morning of surgery [phone]   Remember: Do not eat food or drink liquids :After Midnight.     Take these medicines the morning of surgery with A SIP OF WATER: None                                You may not have any metal on your body including hair pins and              piercings  Do not wear jewelry, lotions, powders, cologne,  or deodorant             Men may shave face and neck.   Do not bring valuables to the hospital. [place].  Contacts, dentures or bridgework may not be worn into surgery.     Patients discharged the day of surgery will not be allowed to drive home.  Name and phone number of your driver:Kwhaw Albaser [phone]                Please read over the following fact sheets you were given: _____________________________________________________________________           [facility] - Preparing for Surgery Before surgery, you can play an important role.  Because skin is not sterile, your skin needs to be as free of germs as possible.  You can reduce the number of germs on your skin by washing with CHG (chlorahexidine gluconate) soap before surgery.  CHG is an antiseptic cleaner which kills germs and bonds with the skin to continue killing germs even after washing. Please DO NOT use if you have an allergy to CHG or antibacterial soaps.  If your skin becomes reddened/irritated stop using the CHG and inform your nurse when you arrive at Short Stay. Do not shave (including legs and underarms) for at least 48 hours prior to the first CHG shower.  You may shave your face/neck. Please follow these instructions carefully:  1.  Shower with CHG Soap the night before surgery and the  morning of Surgery.  2.  If  you choose to wash your hair, wash your hair first as usual with your  normal  shampoo.  3.  After you shampoo, rinse your hair and body thoroughly to remove the  shampoo.                           4.  Use CHG as you would any other liquid soap.  You can apply chg directly  to the skin and wash                       Gently with a scrungie or clean washcloth.  5.  Apply the CHG Soap to your body ONLY FROM THE NECK DOWN.   Do not use on face/ open                           Wound  or open sores. Avoid contact with eyes, ears mouth and genitals (private parts).                       Wash face,  Genitals (private parts) with your normal soap.             6.  Wash thoroughly, paying special attention to the area where your surgery  will be performed.  7.  Thoroughly rinse your body with warm water from the neck down.  8.  DO NOT shower/wash with your normal soap after using and rinsing off  the CHG Soap.                9.  Pat yourself dry with a clean towel.            10.  Wear clean pajamas.            11.  Place clean sheets on your bed the night of your first shower and do not  sleep with pets. Day of Surgery : Do not apply any lotions/deodorants the morning of surgery.  Please wear clean clothes to the hospital/surgery center.  FAILURE TO FOLLOW THESE INSTRUCTIONS MAY RESULT IN THE CANCELLATION OF YOUR SURGERY PATIENT SIGNATURE_________________________________  NURSE SIGNATURE__________________________________  ________________________________________________________________________

## 2017-11-04 ENCOUNTER — Other Ambulatory Visit: Payer: Self-pay

## 2017-11-04 ENCOUNTER — Encounter (HOSPITAL_COMMUNITY)
Admission: RE | Admit: 2017-11-04 | Discharge: 2017-11-04 | Disposition: A | Discharge status: Home / Self Care | Payer: Medicaid Other | Source: Ambulatory Visit | Attending: Surgery | Admitting: Surgery

## 2017-11-04 ENCOUNTER — Encounter (HOSPITAL_COMMUNITY): Payer: Self-pay

## 2017-11-04 DIAGNOSIS — Z01818 Encounter for other preprocedural examination: Secondary | ICD-10-CM | POA: Insufficient documentation

## 2017-11-04 DIAGNOSIS — K4090 Unilateral inguinal hernia, without obstruction or gangrene, not specified as recurrent: Secondary | ICD-10-CM | POA: Insufficient documentation

## 2017-11-04 HISTORY — DX: Gastro-esophageal reflux disease without esophagitis: K21.9

## 2017-11-04 LAB — CBC
HEMATOCRIT: 44.1 % (ref 39.0–52.0)
HEMOGLOBIN: 15.4 g/dL (ref 13.0–17.0)
MCH: 30.6 pg (ref 26.0–34.0)
MCHC: 34.9 g/dL (ref 30.0–36.0)
MCV: 87.7 fL (ref 78.0–100.0)
Platelets: 182 10*3/uL (ref 150–400)
RBC: 5.03 MIL/uL (ref 4.22–5.81)
RDW: 12.5 % (ref 11.5–15.5)
WBC: 4.2 10*3/uL (ref 4.0–10.5)

## 2017-11-04 LAB — BASIC METABOLIC PANEL
ANION GAP: 5 (ref 5–15)
BUN: 9 mg/dL (ref 6–20)
CHLORIDE: 108 mmol/L (ref 98–111)
CO2: 27 mmol/L (ref 22–32)
Calcium: 9.2 mg/dL (ref 8.9–10.3)
Creatinine, Ser: 0.68 mg/dL (ref 0.61–1.24)
GFR calc Af Amer: >60 mL/min (ref >60)
GFR calc non Af Amer: >60 mL/min (ref >60)
Glucose, Bld: 155 mg/dL — ABNORMAL HIGH (ref 70–99)
POTASSIUM: 4 mmol/L (ref 3.5–5.1)
SODIUM: 140 mmol/L (ref 135–145)

## 2017-11-10 NOTE — H&P (Signed)
Birney Lecker Documented: 10/27/2017 12:22 PM Location: [place] Office Patient #: [id number] DOB: [birth year] Undefined / Language: [name] / Race: Black or African American Male   History of Present Illness Rodman Key B. Hassell Done MD; 10/27/2017 12:48 PM) The patient is a 47 year old male who presents with an inguinal hernia. Referred by Dr. Charlott Rakes after well visit for right inguinal hernia. this is been increasing in size. There is no evidence of hernia that has been incarcerated. On exam this is a prominent riding or hernia going down toward his scrotum. I described open inguinal hernia to him and inch and laparoscopic as well but I think because this is going down into his scrotum out probably recommend an open excision of the sac and repair of the hernia. We will go and get this scheduled as an outpatient possibly a cone day surgery. This be done under general anesthesia. I gave him a booklet on hernia repair. He is aware that hernias can recur and that there are potential issues with nerve pain and numbness.   Past Surgical History (April Staton, [place]; 10/27/2017 12:22 PM) No pertinent past surgical history   Allergies (April Staton, [place]; 10/27/2017 12:23 PM) No Known Drug Allergies [10/27/2017]:  Medication History (April Staton, CMA; 10/27/2017 12:23 PM) Meloxicam (7.5MG  Tablet, Oral) Active. Medications Reconciled  Social History (April Staton, CMA; 10/27/2017 12:22 PM) Caffeine use  Coffee.  Family History (April Staton, [place]; 10/27/2017 12:22 PM) Colon Cancer  Mother. Heart Disease  Father.  Other Problems (April Staton, [place]; 10/27/2017 12:22 PM) Hypercholesterolemia  Inguinal Hernia     Review of Systems (April Staton [place]; 10/27/2017 12:22 PM) General Present- Fatigue. Not Present- Appetite Loss, Chills, Fever, Night Sweats, Weight Gain and Weight Loss. Skin Present- Ulcer. Not Present- Change in Wart/Mole, Dryness, Hives, Jaundice, New  Lesions, Non-Healing Wounds and Rash. HEENT Not Present- Earache, Hearing Loss, Hoarseness, Nose Bleed, Oral Ulcers, Ringing in the Ears, Seasonal Allergies, Sinus Pain, Sore Throat, Visual Disturbances, Wears glasses/contact lenses and Yellow Eyes. Respiratory Not Present- Bloody sputum, Chronic Cough, Difficulty Breathing, Snoring and Wheezing. Cardiovascular Not Present- Chest Pain, Difficulty Breathing Lying Down, Leg Cramps, Palpitations, Rapid Heart Rate, Shortness of Breath and Swelling of Extremities. Gastrointestinal Present- Hemorrhoids. Not Present- Abdominal Pain, Bloating, Bloody Stool, Change in Bowel Habits, Chronic diarrhea, Constipation, Difficulty Swallowing, Excessive gas, Gets full quickly at meals, Indigestion, Nausea, Rectal Pain and Vomiting. Male Genitourinary Not Present- Blood in Urine, Change in Urinary Stream, Frequency, Impotence, Nocturia, Painful Urination, Urgency and Urine Leakage.  Vitals (April Staton CMA; 10/27/2017 12:24 PM) 10/27/2017 12:23 PM Weight: 212.13 lb Height: 70in Body Surface Area: 2.14 m Body Mass Index: 30.44 kg/m  Temp.: 98.43F(Oral)  Pulse: 87 (Regular)  BP: 110/80 (Sitting, Left Arm, Standard)       Physical Exam ([place] B. Hassell Done MD; 10/27/2017 12:49 PM) The physical exam findings are as follows: Note:HEENT exam unremarkable. Neck supple Chest clear Heart sinus rhythm without murmurs Abdomen nontender GU prominent riding or hernia that is a scrotal hernia. No left inguinal hernia felt. Extremities full range of motion.    Assessment & Plan Rodman Key B. Hassell Done MD; 10/27/2017 12:50 PM) RIGHT INGUINAL HERNIA (K40.90) Impression: I have discussed running or hernia with him and given him a booklet about open running hernia. He has a scrotal hernia which I think we could do as an outpatient echo and a surgery for [place] as an outpatient. We'll schedule at his convenience.  Matt B. Hassell Done, MD,  FACS

## 2017-11-11 ENCOUNTER — Encounter (HOSPITAL_COMMUNITY): Admission: RE | Payer: Self-pay | Source: Ambulatory Visit

## 2017-11-11 ENCOUNTER — Ambulatory Visit (HOSPITAL_COMMUNITY): Admission: RE | Admit: 2017-11-11 | Payer: Medicaid Other | Source: Ambulatory Visit | Admitting: Surgery

## 2017-11-11 SURGERY — REPAIR, HERNIA, INGUINAL, ADULT
Anesthesia: General | Laterality: Right

## 2017-12-07 ENCOUNTER — Ambulatory Visit: Payer: Medicaid Other | Admitting: Family Medicine

## 2017-12-17 ENCOUNTER — Other Ambulatory Visit: Payer: Self-pay | Admitting: Family Medicine

## 2017-12-17 DIAGNOSIS — K649 Unspecified hemorrhoids: Secondary | ICD-10-CM

## 2017-12-17 DIAGNOSIS — A630 Anogenital (venereal) warts: Secondary | ICD-10-CM

## 2017-12-22 ENCOUNTER — Other Ambulatory Visit: Payer: Self-pay | Admitting: Family Medicine

## 2017-12-22 DIAGNOSIS — A630 Anogenital (venereal) warts: Secondary | ICD-10-CM

## 2017-12-23 NOTE — Telephone Encounter (Signed)
Received a notice the RX for imiquimod failed to send, called pharmacy and gave as a verbal order exactly as the provider had it in the electronic script that failed.

## 2018-01-04 NOTE — Patient Instructions (Addendum)
[place]  01/04/2018   Your procedure is scheduled on: Monday 01/09/2018  Report to [facility] Main  Entrance              Report to admitting at  1015 AM    Call this number if you have problems the morning of surgery [phone]    Remember: Do not eat food or drink liquids :After Midnight.   BRUSH YOUR TEETH MORNING OF SURGERY AND RINSE YOUR MOUTH OUT, NO CHEWING GUM CANDY OR MINTS.     Take these medicines the morning of surgery with A SIP OF WATER: use Restasis eye drops                                 You may not have any metal on your body including hair pins and              piercings  Do not wear jewelry, make-up, lotions, powders or perfumes, deodorant                       Men may shave face and neck.   Do not bring valuables to the hospital. [place].  Contacts, dentures or bridgework may not be worn into surgery.  Leave suitcase in the car. After surgery it may be brought to your room.     Patients discharged the day of surgery will not be allowed to drive home.  Name and phone number of your driver: wife and son               Please read over the following fact sheets you were given: _____________________________________________________________________             [facility] - Preparing for Surgery Before surgery, you can play an important role.  Because skin is not sterile, your skin needs to be as free of germs as possible.  You can reduce the number of germs on your skin by washing with CHG (chlorahexidine gluconate) soap before surgery.  CHG is an antiseptic cleaner which kills germs and bonds with the skin to continue killing germs even after washing. Please DO NOT use if you have an allergy to CHG or antibacterial soaps.  If your skin becomes reddened/irritated stop using the CHG and inform your nurse when you arrive at Short Stay. Do not shave (including legs and  underarms) for at least 48 hours prior to the first CHG shower.  You may shave your face/neck. Please follow these instructions carefully:  1.  Shower with CHG Soap the night before surgery and the  morning of Surgery.  2.  If you choose to wash your hair, wash your hair first as usual with your  normal  shampoo.  3.  After you shampoo, rinse your hair and body thoroughly to remove the  shampoo.                           4.  Use CHG as you would any other liquid soap.  You can apply chg directly  to the skin and wash  Gently with a scrungie or clean washcloth.  5.  Apply the CHG Soap to your body ONLY FROM THE NECK DOWN.   Do not use on face/ open                           Wound or open sores. Avoid contact with eyes, ears mouth and genitals (private parts).                       Wash face,  Genitals (private parts) with your normal soap.             6.  Wash thoroughly, paying special attention to the area where your surgery  will be performed.  7.  Thoroughly rinse your body with warm water from the neck down.  8.  DO NOT shower/wash with your normal soap after using and rinsing off  the CHG Soap.                9.  Pat yourself dry with a clean towel.            10.  Wear clean pajamas.            11.  Place clean sheets on your bed the night of your first shower and do not  sleep with pets. Day of Surgery : Do not apply any lotions/deodorants the morning of surgery.  Please wear clean clothes to the hospital/surgery center.  FAILURE TO FOLLOW THESE INSTRUCTIONS MAY RESULT IN THE CANCELLATION OF YOUR SURGERY PATIENT SIGNATURE_________________________________  NURSE SIGNATURE__________________________________  ________________________________________________________________________

## 2018-01-04 NOTE — Progress Notes (Signed)
Please place orders in Epic as patient has a pre-op appointment on 01/06/2018! Thank you!

## 2018-01-06 ENCOUNTER — Encounter (HOSPITAL_COMMUNITY)
Admission: RE | Admit: 2018-01-06 | Discharge: 2018-01-06 | Disposition: A | Discharge status: Home / Self Care | Payer: Medicaid Other | Source: Ambulatory Visit | Attending: Surgery | Admitting: Surgery

## 2018-01-06 ENCOUNTER — Encounter (HOSPITAL_COMMUNITY): Payer: Self-pay

## 2018-01-06 ENCOUNTER — Other Ambulatory Visit: Payer: Self-pay

## 2018-01-06 ENCOUNTER — Encounter (INDEPENDENT_AMBULATORY_CARE_PROVIDER_SITE_OTHER): Payer: Self-pay

## 2018-01-06 DIAGNOSIS — Z01812 Encounter for preprocedural laboratory examination: Secondary | ICD-10-CM | POA: Insufficient documentation

## 2018-01-06 LAB — BASIC METABOLIC PANEL
Anion gap: 5 (ref 5–15)
BUN: 9 mg/dL (ref 6–20)
CALCIUM: 9.2 mg/dL (ref 8.9–10.3)
CO2: 27 mmol/L (ref 22–32)
CREATININE: 0.76 mg/dL (ref 0.61–1.24)
Chloride: 106 mmol/L (ref 98–111)
GFR calc Af Amer: >60 mL/min (ref >60)
GFR calc non Af Amer: >60 mL/min (ref >60)
GLUCOSE: 152 mg/dL — AB (ref 70–99)
Potassium: 4.1 mmol/L (ref 3.5–5.1)
Sodium: 138 mmol/L (ref 135–145)

## 2018-01-06 LAB — CBC
HCT: 42.6 % (ref 39.0–52.0)
HEMOGLOBIN: 14.8 g/dL (ref 13.0–17.0)
MCH: 30.1 pg (ref 26.0–34.0)
MCHC: 34.7 g/dL (ref 30.0–36.0)
MCV: 86.6 fL (ref 78.0–100.0)
PLATELETS: 180 10*3/uL (ref 150–400)
RBC: 4.92 MIL/uL (ref 4.22–5.81)
RDW: 12.3 % (ref 11.5–15.5)
WBC: 4.4 10*3/uL (ref 4.0–10.5)

## 2018-01-06 LAB — HEMOGLOBIN A1C
HEMOGLOBIN A1C: 6.8 % — AB (ref 4.8–5.6)
Mean Plasma Glucose: 148.46 mg/dL

## 2018-01-06 NOTE — Progress Notes (Signed)
Please place orders in Epic as patient has surgery on 01/09/2018! Thank you!

## 2018-01-09 ENCOUNTER — Ambulatory Visit (HOSPITAL_COMMUNITY): Payer: Medicaid Other | Admitting: Anesthesiology

## 2018-01-09 ENCOUNTER — Encounter (HOSPITAL_COMMUNITY): Payer: Self-pay | Admitting: Certified Registered Nurse Anesthetist

## 2018-01-09 ENCOUNTER — Encounter (HOSPITAL_COMMUNITY)
Admission: RE | Disposition: A | Discharge status: Home / Self Care | Payer: Self-pay | Source: Ambulatory Visit | Attending: Surgery

## 2018-01-09 ENCOUNTER — Ambulatory Visit (HOSPITAL_COMMUNITY)
Admission: RE | Admit: 2018-01-09 | Discharge: 2018-01-09 | Disposition: A | Discharge status: Home / Self Care | Payer: Medicaid Other | Source: Ambulatory Visit | Attending: Surgery | Admitting: Surgery

## 2018-01-09 DIAGNOSIS — Z79899 Other long term (current) drug therapy: Secondary | ICD-10-CM | POA: Insufficient documentation

## 2018-01-09 DIAGNOSIS — K4030 Unilateral inguinal hernia, with obstruction, without gangrene, not specified as recurrent: Secondary | ICD-10-CM | POA: Insufficient documentation

## 2018-01-09 DIAGNOSIS — K219 Gastro-esophageal reflux disease without esophagitis: Secondary | ICD-10-CM | POA: Diagnosis not present

## 2018-01-09 DIAGNOSIS — K4090 Unilateral inguinal hernia, without obstruction or gangrene, not specified as recurrent: Secondary | ICD-10-CM | POA: Diagnosis present

## 2018-01-09 DIAGNOSIS — E7800 Pure hypercholesterolemia, unspecified: Secondary | ICD-10-CM | POA: Diagnosis not present

## 2018-01-09 DIAGNOSIS — Z8249 Family history of ischemic heart disease and other diseases of the circulatory system: Secondary | ICD-10-CM | POA: Diagnosis not present

## 2018-01-09 DIAGNOSIS — Z9889 Other specified postprocedural states: Secondary | ICD-10-CM

## 2018-01-09 DIAGNOSIS — Z8719 Personal history of other diseases of the digestive system: Secondary | ICD-10-CM

## 2018-01-09 HISTORY — PX: INSERTION OF MESH: SHX5868

## 2018-01-09 HISTORY — PX: INGUINAL HERNIA REPAIR: SHX194

## 2018-01-09 SURGERY — REPAIR, HERNIA, INGUINAL, ADULT
Anesthesia: General | Site: Inguinal | Laterality: Right

## 2018-01-09 MED ORDER — HYDROCODONE-ACETAMINOPHEN 5-325 MG PO TABS: 1.0000 | ORAL_TABLET | Freq: Four times a day (QID) | ORAL | 0 refills | Status: DC | PRN

## 2018-01-09 MED ORDER — FENTANYL CITRATE (PF) 100 MCG/2ML IJ SOLN
50.0000 ug | INTRAMUSCULAR | Status: DC
  Administered 2018-01-09: 50 ug via INTRAVENOUS
  Filled 2018-01-09: qty 2

## 2018-01-09 MED ORDER — FENTANYL CITRATE (PF) 100 MCG/2ML IJ SOLN
INTRAMUSCULAR | Status: DC | PRN
  Administered 2018-01-09 (×4): 50 ug via INTRAVENOUS

## 2018-01-09 MED ORDER — SODIUM CHLORIDE 0.9 % IJ SOLN
INTRAMUSCULAR | Status: AC
  Filled 2018-01-09: qty 10

## 2018-01-09 MED ORDER — PROPOFOL 10 MG/ML IV BOLUS
INTRAVENOUS | Status: DC | PRN
  Administered 2018-01-09: 200 mg via INTRAVENOUS

## 2018-01-09 MED ORDER — CEFAZOLIN SODIUM-DEXTROSE 2-4 GM/100ML-% IV SOLN
2.0000 g | INTRAVENOUS | Status: AC
  Administered 2018-01-09: 2 g via INTRAVENOUS
  Filled 2018-01-09: qty 100

## 2018-01-09 MED ORDER — FENTANYL CITRATE (PF) 100 MCG/2ML IJ SOLN: 25.0000 ug | INTRAMUSCULAR | Status: DC | PRN

## 2018-01-09 MED ORDER — BUPIVACAINE LIPOSOME 1.3 % IJ SUSP
20.0000 mL | Freq: Once | INTRAMUSCULAR | Status: DC
  Filled 2018-01-09: qty 20

## 2018-01-09 MED ORDER — LIDOCAINE 2% (20 MG/ML) 5 ML SYRINGE
INTRAMUSCULAR | Status: AC
  Filled 2018-01-09: qty 5

## 2018-01-09 MED ORDER — LIDOCAINE 2% (20 MG/ML) 5 ML SYRINGE
INTRAMUSCULAR | Status: DC | PRN
  Administered 2018-01-09: 20 mg via INTRAVENOUS

## 2018-01-09 MED ORDER — MIDAZOLAM HCL 2 MG/2ML IJ SOLN
1.0000 mg | INTRAMUSCULAR | Status: DC
  Administered 2018-01-09: 1 mg via INTRAVENOUS
  Filled 2018-01-09: qty 2

## 2018-01-09 MED ORDER — FENTANYL CITRATE (PF) 250 MCG/5ML IJ SOLN
INTRAMUSCULAR | Status: AC
  Filled 2018-01-09: qty 5

## 2018-01-09 MED ORDER — 0.9 % SODIUM CHLORIDE (POUR BTL) OPTIME
TOPICAL | Status: DC | PRN
  Administered 2018-01-09: 1000 mL

## 2018-01-09 MED ORDER — DEXAMETHASONE SODIUM PHOSPHATE 10 MG/ML IJ SOLN
INTRAMUSCULAR | Status: AC
  Filled 2018-01-09: qty 1

## 2018-01-09 MED ORDER — CHLORHEXIDINE GLUCONATE CLOTH 2 % EX PADS: 6.0000 | MEDICATED_PAD | Freq: Once | CUTANEOUS | Status: DC

## 2018-01-09 MED ORDER — BUPIVACAINE LIPOSOME 1.3 % IJ SUSP
INTRAMUSCULAR | Status: DC | PRN
  Administered 2018-01-09: 20 mL

## 2018-01-09 MED ORDER — ROCURONIUM BROMIDE 10 MG/ML (PF) SYRINGE
PREFILLED_SYRINGE | INTRAVENOUS | Status: AC
  Filled 2018-01-09: qty 10

## 2018-01-09 MED ORDER — GABAPENTIN 300 MG PO CAPS
300.0000 mg | ORAL_CAPSULE | ORAL | Status: AC
  Administered 2018-01-09: 300 mg via ORAL
  Filled 2018-01-09: qty 1

## 2018-01-09 MED ORDER — LACTATED RINGERS IV SOLN
INTRAVENOUS | Status: DC
  Administered 2018-01-09 (×2): via INTRAVENOUS

## 2018-01-09 MED ORDER — ONDANSETRON HCL 4 MG/2ML IJ SOLN
INTRAMUSCULAR | Status: DC | PRN
  Administered 2018-01-09: 4 mg via INTRAVENOUS

## 2018-01-09 MED ORDER — CLONIDINE HCL (ANALGESIA) 100 MCG/ML EP SOLN
EPIDURAL | Status: DC | PRN
  Administered 2018-01-09: 50 ug

## 2018-01-09 MED ORDER — DEXAMETHASONE SODIUM PHOSPHATE 10 MG/ML IJ SOLN
INTRAMUSCULAR | Status: DC | PRN
  Administered 2018-01-09: 10 mg via INTRAVENOUS

## 2018-01-09 MED ORDER — FENTANYL CITRATE (PF) 100 MCG/2ML IJ SOLN
INTRAMUSCULAR | Status: AC
  Filled 2018-01-09: qty 2

## 2018-01-09 MED ORDER — PROPOFOL 10 MG/ML IV BOLUS
INTRAVENOUS | Status: AC
  Filled 2018-01-09: qty 20

## 2018-01-09 MED ORDER — BUPIVACAINE-EPINEPHRINE (PF) 0.5% -1:200000 IJ SOLN
INTRAMUSCULAR | Status: DC | PRN
  Administered 2018-01-09: 30 mL

## 2018-01-09 MED ORDER — MIDAZOLAM HCL 5 MG/5ML IJ SOLN
INTRAMUSCULAR | Status: DC | PRN
  Administered 2018-01-09 (×2): 1 mg via INTRAVENOUS

## 2018-01-09 MED ORDER — HEPARIN SODIUM (PORCINE) 5000 UNIT/ML IJ SOLN
5000.0000 [IU] | Freq: Once | INTRAMUSCULAR | Status: AC
  Administered 2018-01-09: 5000 [IU] via SUBCUTANEOUS
  Filled 2018-01-09: qty 1

## 2018-01-09 MED ORDER — PROMETHAZINE HCL 25 MG/ML IJ SOLN: 6.2500 mg | INTRAMUSCULAR | Status: DC | PRN

## 2018-01-09 MED ORDER — MIDAZOLAM HCL 2 MG/2ML IJ SOLN
INTRAMUSCULAR | Status: AC
  Filled 2018-01-09: qty 2

## 2018-01-09 SURGICAL SUPPLY — 31 items
BLADE SURG 15 STRL LF DISP TIS (BLADE) ×1 IMPLANT
BLADE SURG 15 STRL SS (BLADE) ×2
COVER SURGICAL LIGHT HANDLE (MISCELLANEOUS) ×3 IMPLANT
DECANTER SPIKE VIAL GLASS SM (MISCELLANEOUS) ×3 IMPLANT
DERMABOND ADVANCED (GAUZE/BANDAGES/DRESSINGS) ×2
DERMABOND ADVANCED .7 DNX12 (GAUZE/BANDAGES/DRESSINGS) ×1 IMPLANT
DISSECTOR ROUND CHERRY 3/8 STR (MISCELLANEOUS) ×3 IMPLANT
DRAIN PENROSE 18X1/2 LTX STRL (DRAIN) ×3 IMPLANT
DRAPE LAPAROTOMY TRNSV 102X78 (DRAPE) ×3 IMPLANT
ELECT PENCIL ROCKER SW 15FT (MISCELLANEOUS) ×3 IMPLANT
ELECT REM PT RETURN 15FT ADLT (MISCELLANEOUS) ×3 IMPLANT
GLOVE BIOGEL M 8.0 STRL (GLOVE) ×3 IMPLANT
GOWN STRL REUS W/TWL XL LVL3 (GOWN DISPOSABLE) ×6 IMPLANT
KIT BASIN OR (CUSTOM PROCEDURE TRAY) ×3 IMPLANT
MESH HERNIA 3X6 (Mesh General) ×3 IMPLANT
NEEDLE HYPO 22GX1.5 SAFETY (NEEDLE) ×3 IMPLANT
PACK BASIC VI WITH GOWN DISP (CUSTOM PROCEDURE TRAY) ×3 IMPLANT
SPONGE LAP 4X18 RFD (DISPOSABLE) ×3 IMPLANT
STAPLER VISISTAT 35W (STAPLE) ×3 IMPLANT
SUT MNCRL AB 4-0 PS2 18 (SUTURE) ×3 IMPLANT
SUT PROLENE 2 0 CT2 30 (SUTURE) IMPLANT
SUT SILK 2 0 SH (SUTURE) IMPLANT
SUT VIC AB 2-0 SH 27 (SUTURE) ×2
SUT VIC AB 2-0 SH 27X BRD (SUTURE) ×1 IMPLANT
SUT VIC AB 4-0 SH 18 (SUTURE) ×3 IMPLANT
SUT VICRYL 4-0 (SUTURE) ×3 IMPLANT
SYR 20CC LL (SYRINGE) ×3 IMPLANT
SYR BULB IRRIGATION 50ML (SYRINGE) ×3 IMPLANT
TOWEL OR 17X26 10 PK STRL BLUE (TOWEL DISPOSABLE) ×3 IMPLANT
TOWEL OR NON WOVEN STRL DISP B (DISPOSABLE) ×3 IMPLANT
YANKAUER SUCT BULB TIP 10FT TU (MISCELLANEOUS) ×3 IMPLANT

## 2018-01-09 NOTE — Op Note (Signed)
[place]  1970-11-18 30 Sept 2019    PCP:  Charlott Rakes, MD   Surgeon: Kaylyn Lim, MD, FACS  Asst:  Sherlean Foot, RNFA  Anes:  general  Preop Dx: Right scrotal hernia Postop Dx: Right indirect inguinal scrotal hernia  Procedure: Open right inguinal hernia repair with mesh Location Surgery: [facility] 2 Complications: none  EBL:   15 cc  Drains: none  Description of Procedure:  The patient was taken to OR 2 .  After anesthesia was administered and the patient was prepped  with [employer]are and a timeout was performed.  The right side had been marked and permit signed for a right inguinal hernia.  An oblique incision was made carried down through the fairly prominent fatty tissue to the external oblique.  There was bulging of the external ring and I opened the external fascia and found a very thick cord and mobilized this with a Penrose drain.  I then incised the cremasteric muscles and then began dissecting a sac which ultimately contained incarcerated omentum from the inguinal structures completely and was able to take this up to the neck into the abdomen.  I went ahead and opened it at the tip and manually reduce the incarcerated omentum into the abdomen.  Under direct vision I then placed a high ligation suture of 2-0 silk in a figure-of-eight fashion tying that around the neck and then twisting it and tying it multiple times with to secure it.  The excess sac was cut off and this was allowed to retract up in the abdomen.  The floor was then repaired with a piece of Marlex mesh cut to fit sutured along the inguinal ligament with running 2-0 Prolene and medially with running 2-0 Prolene and then it was cut and brought around the cord structures and sutured to itself.  Hemostasis was present.  External oblique was closed with running 2-0 Vicryl.  20 cc of Exparel was injected into the wound which was then closed and layers with 4-0 Vicryl and then 4-0 subcuticular Monocryl and  Dermabond.  The patient tolerated the procedure well and was taken to the PACU in stable condition.     Matt B. Hassell Done, [place], [facility] Surgery, [place]

## 2018-01-09 NOTE — Transfer of Care (Signed)
Immediate Anesthesia Transfer of Care Note  Patient: Philip Young  Procedure(s) Performed: OPEN RIGHT INGUINAL HERNAI REPAIR ERAS PATHWAY (Right Inguinal) INSERTION OF MESH (Right Inguinal)  Patient Location: PACU  Anesthesia Type:General  Level of Consciousness: awake, alert , oriented and patient cooperative  Airway & Oxygen Therapy: Patient Spontanous Breathing and Patient connected to face mask oxygen  Post-op Assessment: Report given to RN, Post -op Vital signs reviewed and stable and Patient moving all extremities  Post vital signs: Reviewed and stable  Last Vitals:  Vitals Value Taken Time  BP 130/73 01/09/2018  3:03 PM  Temp 36.4 C 01/09/2018  3:03 PM  Pulse 75 01/09/2018  3:10 PM  Resp 10 01/09/2018  3:10 PM  SpO2 98 % 01/09/2018  3:10 PM  Vitals shown include unvalidated device data.  Last Pain:  Vitals:   01/09/18 1503  TempSrc:   PainSc: (P) Asleep         Complications: No apparent anesthesia complications

## 2018-01-09 NOTE — Anesthesia Procedure Notes (Signed)
Procedure Name: LMA Insertion Date/Time: 01/09/2018 1:11 PM Performed by: [place] Scotlyn Mccranie C, CRNA Pre-anesthesia Checklist: Patient identified, Emergency Drugs available, Suction available and Patient being monitored Patient Re-evaluated:Patient Re-evaluated prior to induction Oxygen Delivery Method: Circle system utilized Preoxygenation: Pre-oxygenation with 100% oxygen Induction Type: IV induction Ventilation: Mask ventilation without difficulty LMA: LMA inserted LMA Size: 4.0 Number of attempts: 1 Airway Equipment and Method: Bite block Placement Confirmation: positive ETCO2 Tube secured with: Tape Dental Injury: Teeth and Oropharynx as per pre-operative assessment

## 2018-01-09 NOTE — Progress Notes (Signed)
Assisted Dr. Rodman Comp with right, ultrasound guided, transabdominal plane block. Side rails up, monitors on throughout procedure. See vital signs in flow sheet. Tolerated Procedure well.

## 2018-01-09 NOTE — Anesthesia Preprocedure Evaluation (Signed)
Anesthesia Evaluation  Patient identified by MRN, date of birth, ID band Patient awake    Reviewed: Allergy & Precautions, NPO status , Patient's Chart, lab work & pertinent test results  Airway Mallampati: II  TM Distance: >3 FB Neck ROM: Full    Dental  (+) Dental Advisory Given   Pulmonary neg pulmonary ROS,    breath sounds clear to auscultation       Cardiovascular negative cardio ROS   Rhythm:Regular Rate:Normal     Neuro/Psych negative neurological ROS     GI/Hepatic Neg liver ROS, GERD  ,  Endo/Other  negative endocrine ROS  Renal/GU negative Renal ROS     Musculoskeletal   Abdominal   Peds  Hematology negative hematology ROS (+)   Anesthesia Other Findings   Reproductive/Obstetrics                             Lab Results  Component Value Date   WBC 4.4 01/06/2018   HGB 14.8 01/06/2018   HCT 42.6 01/06/2018   MCV 86.6 01/06/2018   PLT 180 01/06/2018   Lab Results  Component Value Date   CREATININE 0.76 01/06/2018   BUN 9 01/06/2018   NA 138 01/06/2018   K 4.1 01/06/2018   CL 106 01/06/2018   CO2 27 01/06/2018    Anesthesia Physical Anesthesia Plan  ASA: I  Anesthesia Plan: General   Post-op Pain Management:    Induction: Intravenous  PONV Risk Score and Plan: 2 and Ondansetron, Dexamethasone and Treatment may vary due to age or medical condition  Airway Management Planned: Oral ETT and LMA  Additional Equipment:   Intra-op Plan:   Post-operative Plan: Extubation in OR  Informed Consent: I have reviewed the patients History and Physical, chart, labs and discussed the procedure including the risks, benefits and alternatives for the proposed anesthesia with the patient or authorized representative who has indicated his/her understanding and acceptance.   Dental advisory given  Plan Discussed with: CRNA  Anesthesia Plan Comments:          Anesthesia Quick Evaluation

## 2018-01-09 NOTE — Discharge Instructions (Signed)
Open Hernia Repair, Adult, Care After °These instructions give you information about caring for yourself after your procedure. Your doctor may also give you more specific instructions. If you have problems or questions, contact your doctor. °Follow these instructions at home: °Surgical cut (incision) care ° °· Follow instructions from your doctor about how to take care of your surgical cut area. Make sure you: °? Wash your hands with soap and water before you change your bandage (dressing). If you cannot use soap and water, use hand sanitizer. °? Change your bandage as told by your doctor. °? Leave stitches (sutures), skin glue, or skin tape (adhesive) strips in place. They may need to stay in place for 2 weeks or longer. If tape strips get loose and curl up, you may trim the loose edges. Do not remove tape strips completely unless your doctor says it is okay. °· Check your surgical cut every day for signs of infection. Check for: °? More redness, swelling, or pain. °? More fluid or blood. °? Warmth. °? Pus or a bad smell. °Activity °· Do not drive or use heavy machinery while taking prescription pain medicine. Do not drive until your doctor says it is okay. °· Until your doctor says it is okay: °? Do not lift anything that is heavier than 10 lb (4.5 kg). °? Do not play contact sports. °· Return to your normal activities as told by your doctor. Ask your doctor what activities are safe. °General instructions °· To prevent or treat having a hard time pooping (constipation) while you are taking prescription pain medicine, your doctor may recommend that you: °? Drink enough fluid to keep your pee (urine) clear or pale yellow. °? Take over-the-counter or prescription medicines. °? Eat foods that are high in fiber, such as fresh fruits and vegetables, whole grains, and beans. °? Limit foods that are high in fat and processed sugars, such as fried and sweet foods. °· Take over-the-counter and prescription medicines only as  told by your doctor. °· Do not take baths, swim, or use a hot tub until your doctor says it is okay. °· Keep all follow-up visits as told by your doctor. This is important. °Contact a doctor if: °· You develop a rash. °· You have more redness, swelling, or pain around your surgical cut. °· You have more fluid or blood coming from your surgical cut. °· Your surgical cut feels warm to the touch. °· You have pus or a bad smell coming from your surgical cut. °· You have a fever or chills. °· You have blood in your poop (stool). °· You have not pooped in 2-3 days. °· Medicine does not help your pain. °Get help right away if: °· You have chest pain or you are short of breath. °· You feel light-headed. °· You feel weak and dizzy (feel faint). °· You have very bad pain. °· You throw up (vomit) and your pain is worse. °This information is not intended to replace advice given to you by your health care provider. Make sure you discuss any questions you have with your health care provider. °Document Released: 04/19/2014 Document Revised: 10/17/2015 Document Reviewed: 09/10/2015 °Elsevier Interactive Patient Education © 2018 [employer] Inc[employer] ° °

## 2018-01-09 NOTE — Interval H&P Note (Signed)
History and Physical Interval Note:  01/09/2018 12:59 PM  Philip Young  has presented today for surgery, with the diagnosis of right inguinal hernia  The various methods of treatment have been discussed with the patient and family. After consideration of risks, benefits and other options for treatment, the patient has consented to  Procedure(s): [place] (Right) as a surgical intervention .  The patient's history has been reviewed, patient examined, no change in status, stable for surgery.  I have reviewed the patient's chart and labs.  Questions were answered to the patient's satisfaction.     [name]

## 2018-01-09 NOTE — Anesthesia Postprocedure Evaluation (Signed)
Anesthesia Post Note  Patient: [name]  Procedure(s) Performed: OPEN RIGHT INGUINAL [place] (Right Inguinal) INSERTION OF MESH (Right Inguinal)     Patient location during evaluation: PACU Anesthesia Type: General Level of consciousness: awake and alert Pain management: pain level controlled Vital Signs Assessment: post-procedure vital signs reviewed and stable Respiratory status: spontaneous breathing, nonlabored ventilation, respiratory function stable and patient connected to nasal cannula oxygen Cardiovascular status: blood pressure returned to baseline and stable Postop Assessment: no apparent nausea or vomiting Anesthetic complications: no    Last Vitals:  Vitals:   01/09/18 1730 01/09/18 1745  BP: (!) 133/91 140/79  Pulse: 72 78  Resp:  14  Temp: 36.7 C   SpO2: 98% 98%    Last Pain:  Vitals:   01/09/18 1745  TempSrc:   PainSc: 2                  Tiajuana Amass

## 2018-01-09 NOTE — Anesthesia Procedure Notes (Signed)
Anesthesia Regional Block: TAP block   Pre-Anesthetic Checklist: ,, timeout performed, Correct Patient, Correct Site, Correct Laterality, Correct Procedure, Correct Position, site marked, Risks and benefits discussed,  Surgical consent,  Pre-op evaluation,  At surgeon's request and post-op pain management  Laterality: Right  Prep: chloraprep       Needles:  Injection technique: Single-shot  Needle Type: Echogenic Needle     Needle Length: 9cm  Needle Gauge: 21     Additional Needles:   Procedures:,,,, ultrasound used (permanent image in chart),,,,  Narrative:  Start time: 01/09/2018 11:40 AM End time: 01/09/2018 11:47 AM Injection made incrementally with aspirations every 5 mL.  Performed by: Personally  Anesthesiologist: Suzette Battiest, MD

## 2018-01-09 NOTE — H&P (Signed)
Philip Young Location: [place] Office Patient #: [phone] DOB: 02-14-1971 Undefined / Language: [name] / Race: Black or African American Male   History of Present Illness  The patient is a 47 year old male who presents with an inguinal hernia. Referred by Dr. Charlott Rakes after well visit for right inguinal hernia. this is been increasing in size. There is no evidence of hernia that has been incarcerated. On exam this is a prominent riding or hernia going down toward his scrotum. I described open inguinal hernia to him and inch and laparoscopic as well but I think because this is going down into his scrotum out probably recommend an open excision of the sac and repair of the hernia. We will go and get this scheduled as an outpatient possibly a cone day surgery. This be done under general anesthesia. I gave him a booklet on hernia repair. He is aware that hernias can recur and that there are potential issues with nerve pain and numbness. This procedure was scheduled back in the summer and was rescheduled.   Past Surgical History  No pertinent past surgical history   Allergies  No Known Drug Allergies  Medication History  Meloxicam (7.5MG  Tablet, Oral) Active. Medications Reconciled  Social History  Caffeine use  Coffee.  Family History  Colon Cancer  Mother. Heart Disease  Father.  Other Problems  Hypercholesterolemia  Inguinal Hernia     Review of Systems General Present- Fatigue. Not Present- Appetite Loss, Chills, Fever, Night Sweats, Weight Gain and Weight Loss. Skin Present- Ulcer. Not Present- Change in Wart/Mole, Dryness, Hives, Jaundice, New Lesions, Non-Healing Wounds and Rash. HEENT Not Present- Earache, Hearing Loss, Hoarseness, Nose Bleed, Oral Ulcers, Ringing in the Ears, Seasonal Allergies, Sinus Pain, Sore Throat, Visual Disturbances, Wears glasses/contact lenses and Yellow Eyes. Respiratory Not Present- Bloody  sputum, Chronic Cough, Difficulty Breathing, Snoring and Wheezing. Cardiovascular Not Present- Chest Pain, Difficulty Breathing Lying Down, Leg Cramps, Palpitations, Rapid Heart Rate, Shortness of Breath and Swelling of Extremities. Gastrointestinal Present- Hemorrhoids. Not Present- Abdominal Pain, Bloating, Bloody Stool, Change in Bowel Habits, Chronic diarrhea, Constipation, Difficulty Swallowing, Excessive gas, Gets full quickly at meals, Indigestion, Nausea, Rectal Pain and Vomiting. Male Genitourinary Not Present- Blood in Urine, Change in Urinary Stream, Frequency, Impotence, Nocturia, Painful Urination, Urgency and Urine Leakage.  Vitals  Weight: 212.13 lb Height: 70in Body Surface Area: 2.14 m Body Mass Index: 30.44 kg/m  Temp.: 98.28F(Oral)  Pulse: 87 (Regular)  BP: 110/80 (Sitting, Left Arm, Standard)   Physical Exam  The physical exam findings are as follows: Note:HEENT exam unremarkable. Neck supple Chest clear Heart sinus rhythm without murmurs Abdomen nontender GU prominent riding inguinal hernia that is a scrotal hernia. No left inguinal hernia felt. Extremities full range of motion.    Assessment & Plan Rodman Key B. Hassell Done MD; 10/27/2017 12:50 PM) RIGHT INGUINAL HERNIA (K40.90) Impression: I have discussed open right inguinal hernia repair with him and given him a booklet about open hernia repair. He has a scrotal hernia which I think we could do as an outpatient atr [place] [facility] as an outpatient. For open right inguinal hernia repair today  Matt B. Hassell Done, MD, FACS

## 2018-01-10 ENCOUNTER — Encounter (HOSPITAL_COMMUNITY): Payer: Self-pay | Admitting: Surgery

## 2018-03-07 ENCOUNTER — Other Ambulatory Visit: Payer: Self-pay | Admitting: Family Medicine

## 2018-03-07 DIAGNOSIS — K649 Unspecified hemorrhoids: Secondary | ICD-10-CM

## 2018-06-22 ENCOUNTER — Emergency Department (HOSPITAL_COMMUNITY)
Admission: EM | Admit: 2018-06-22 | Discharge: 2018-06-23 | Disposition: A | Discharge status: Home / Self Care | Payer: Medicaid Other | Attending: Emergency Medicine | Admitting: Emergency Medicine

## 2018-06-22 ENCOUNTER — Other Ambulatory Visit: Payer: Self-pay

## 2018-06-22 ENCOUNTER — Encounter (HOSPITAL_COMMUNITY): Payer: Self-pay

## 2018-06-22 DIAGNOSIS — R05 Cough: Secondary | ICD-10-CM | POA: Diagnosis present

## 2018-06-22 DIAGNOSIS — Z79899 Other long term (current) drug therapy: Secondary | ICD-10-CM | POA: Diagnosis not present

## 2018-06-22 DIAGNOSIS — J3089 Other allergic rhinitis: Secondary | ICD-10-CM

## 2018-06-22 DIAGNOSIS — J302 Other seasonal allergic rhinitis: Secondary | ICD-10-CM | POA: Insufficient documentation

## 2018-06-22 MED ORDER — LORATADINE 10 MG PO TABS
10.0000 mg | ORAL_TABLET | Freq: Once | ORAL | Status: AC
  Administered 2018-06-22: 10 mg via ORAL
  Filled 2018-06-22: qty 1

## 2018-06-22 NOTE — ED Provider Notes (Signed)
[facility] EMERGENCY DEPARTMENT Provider Note   CSN: [id number] Arrival date & time: 06/22/18  2114    History   Chief Complaint Chief Complaint  Patient presents with   Cough    HPI [name] is a 48 y.o. male.     48 year old male presents to the emergency department for evaluation of cough which began yesterday.  Cough has been nonproductive, sporadic.  Symptoms associated with itchy, watery eyes, mild rhinorrhea.  Does endorse prior seasonal allergies.  He has not taken any medications for his symptoms.  He has not had any fevers, body aches, shortness of breath, sick contacts.  Denies recent travel.  Came to the emergency department because he was concerned about potentially giving his family and illness due to a virus.  The history is provided by the patient. No language interpreter was used.  Cough    Past Medical History:  Diagnosis Date   GERD (gastroesophageal reflux disease)    History of hemorrhoids 07/30/2014    Patient Active Problem List   Diagnosis Date Noted   S/P right inguinal herniorrhaphy Sept 2019 01/09/2018   Tobacco abuse 09/06/2017   History of hemorrhoids 07/30/2014   Family history of diabetes mellitus (DM) 07/30/2014   Numerous moles 07/30/2014    Past Surgical History:  Procedure Laterality Date   EXTERNAL EAR SURGERY     history of nose surgery      INGUINAL HERNIA REPAIR Right 01/09/2018   Procedure: OPEN RIGHT INGUINAL [place];  Surgeon: Johnathan Hausen, MD;  Location: [facility];  Service: General;  Laterality: Right;   INSERTION OF MESH Right 01/09/2018   Procedure: INSERTION OF MESH;  Surgeon: Johnathan Hausen, MD;  Location: [facility];  Service: General;  Laterality: Right;        Home Medications    Prior to Admission medications   Medication Sig Start Date End Date Taking? Authorizing Provider  HYDROcodone-acetaminophen (NORCO/VICODIN) 5-325 MG tablet Take 1 tablet by mouth  every 6 (six) hours as needed for moderate pain. 01/09/18   Johnathan Hausen, MD  imiquimod (ALDARA) 5 % cream APPLY TOPICALLY 3 (THREE) TIMES A WEEK. Patient taking differently: Apply 1 application topically 3 (three) times a week.  12/23/17   Charlott Rakes, MD  meloxicam (MOBIC) 7.5 MG tablet Take 1 tablet (7.5 mg total) by mouth daily. 09/06/17   Charlott Rakes, MD  PROCTOFOAM [facility] rectal foam PLACE 1 APPLICATION RECTALLY 3 (THREE) TIMES DAILY AS NEEDED FOR ANAL ITCHING. Patient taking differently: Place 1 applicator rectally 3 (three) times daily as needed for anal itching.  12/22/17   Charlott Rakes, MD  RESTASIS 0.05 % ophthalmic emulsion Place 1 drop into both eyes 2 (two) times daily. 11/19/17   [provider]    Family History Family History  Problem Relation Age of Onset   Diabetes Mother    Cancer Mother        colon cancer    Heart disease Father    Stroke Father     Social History Social History   Tobacco Use   Smoking status: Never Smoker   Smokeless tobacco: Never Used   Tobacco comment: patient smokes 1-2 times weekly  Substance Use Topics   Alcohol use: No    Alcohol/week: 0.0 standard drinks   Drug use: No     Allergies   Patient has no known allergies.   Review of Systems Review of Systems  Respiratory: Positive for cough.    Ten systems  reviewed and are negative for acute change, except as noted in the HPI.    Physical Exam Updated Vital Signs BP (!) 131/91    Pulse 98    Temp 97.8 F (36.6 C) (Oral)    Resp 19    SpO2 99%   Physical Exam Vitals signs and nursing note reviewed.  Constitutional:      General: He is not in acute distress.    Appearance: He is well-developed. He is not diaphoretic.     Comments: Nontoxic appearing and in NAD  HENT:     Head: Normocephalic and atraumatic.  Eyes:     General: No scleral icterus.    Comments: Mild conjunctival injection bilaterally.  Neck:     Musculoskeletal: Normal range of  motion.  Cardiovascular:     Rate and Rhythm: Normal rate and regular rhythm.     Pulses: Normal pulses.  Pulmonary:     Effort: Pulmonary effort is normal. No respiratory distress.     Breath sounds: No stridor. No wheezing, rhonchi or rales.     Comments: Lungs CTAB. Respirations even and unlabored. Sporadic dry, nonproductive cough. Musculoskeletal: Normal range of motion.  Skin:    General: Skin is warm and dry.     Coloration: Skin is not pale.     Findings: No erythema or rash.  Neurological:     Mental Status: He is alert and oriented to person, place, and time.  Psychiatric:        Behavior: Behavior normal.      ED Treatments / Results  Labs (all labs ordered are listed, but only abnormal results are displayed) Labs Reviewed - No data to display  EKG None  Radiology No results found.  Procedures Procedures (including critical care time)  Medications Ordered in ED Medications  loratadine (CLARITIN) tablet 10 mg (has no administration in time range)     Initial Impression / Assessment and Plan / ED Course  I have reviewed the triage vital signs and the nursing notes.  Pertinent labs & imaging results that were available during my care of the patient were reviewed by me and considered in my medical decision making (see chart for details).        Patient presenting for symptoms which are clinically consistent with seasonal allergies.  He has been instructed to take daily Zyrtec or Claritin for management.  No fevers, tachypnea, dyspnea, hypoxia.  Lung sounds clear bilaterally.  Encouraged primary care follow-up as needed.  Return precautions discussed and provided. Patient discharged in stable condition with no unaddressed concerns.   Final Clinical Impressions(s) / ED Diagnoses   Final diagnoses:  Environmental and seasonal allergies    ED Discharge Orders    None       Antonietta Breach, Hershal Coria 06/22/18 2338    Isla Pence, MD 06/22/18 2350

## 2018-06-22 NOTE — ED Triage Notes (Signed)
Pt here for a cough that began 1 day ago.  Also complains of itchiness over the chest and arms.  No shortness of breath. A&Ox4 ambulatory to triage

## 2018-06-22 NOTE — Discharge Instructions (Signed)
We recommend use of daily Zyrtec or Claritin for management of symptoms.  Follow-up with your primary care doctor for recheck in 1 week, as needed.  You may return for new or concerning symptoms.  Continue to practice good hygiene practices including handwashing, coughing into your elbow.

## 2018-07-12 ENCOUNTER — Encounter (HOSPITAL_COMMUNITY): Payer: Self-pay | Admitting: Emergency Medicine

## 2018-07-12 ENCOUNTER — Ambulatory Visit (INDEPENDENT_AMBULATORY_CARE_PROVIDER_SITE_OTHER): Payer: Medicaid Other

## 2018-07-12 ENCOUNTER — Other Ambulatory Visit: Payer: Self-pay

## 2018-07-12 ENCOUNTER — Ambulatory Visit (HOSPITAL_COMMUNITY)
Admission: EM | Admit: 2018-07-12 | Discharge: 2018-07-12 | Disposition: A | Discharge status: Home / Self Care | Payer: Medicaid Other | Attending: Family Medicine | Admitting: Family Medicine

## 2018-07-12 DIAGNOSIS — R05 Cough: Secondary | ICD-10-CM | POA: Diagnosis not present

## 2018-07-12 DIAGNOSIS — J069 Acute upper respiratory infection, unspecified: Secondary | ICD-10-CM

## 2018-07-12 DIAGNOSIS — B9789 Other viral agents as the cause of diseases classified elsewhere: Secondary | ICD-10-CM

## 2018-07-12 MED ORDER — ALBUTEROL SULFATE HFA 108 (90 BASE) MCG/ACT IN AERS
2.0000 | INHALATION_SPRAY | Freq: Once | RESPIRATORY_TRACT | Status: AC
  Administered 2018-07-12: 2 via RESPIRATORY_TRACT

## 2018-07-12 MED ORDER — CETIRIZINE-PSEUDOEPHEDRINE ER 5-120 MG PO TB12: 1.0000 | ORAL_TABLET | Freq: Every day | ORAL | 0 refills | Status: AC

## 2018-07-12 MED ORDER — BENZONATATE 100 MG PO CAPS: 100.0000 mg | ORAL_CAPSULE | Freq: Three times a day (TID) | ORAL | 0 refills | Status: DC

## 2018-07-12 MED ORDER — ALBUTEROL SULFATE HFA 108 (90 BASE) MCG/ACT IN AERS
INHALATION_SPRAY | RESPIRATORY_TRACT | Status: AC
  Filled 2018-07-12: qty 6.7

## 2018-07-12 MED ORDER — FLUTICASONE PROPIONATE 50 MCG/ACT NA SUSP: 2.0000 | Freq: Every day | NASAL | 0 refills | Status: AC

## 2018-07-12 NOTE — Discharge Instructions (Addendum)
X-rays did not show pneumonia.  Symptoms most likely viral Get plenty of rest and push fluids Tessalon Perles prescribed for cough Discontinue claritin Zyrtec-D prescribed for nasal congestion, runny nose, and/or sore throat Flonase prescribed for nasal congestion and runny nose Albuterol inhaler as needed for cough spasms Use OTC medications like ibuprofen or tylenol as needed fever or pain Follow up with PCP or with [facility] if symptoms persist CALL or go to ER if you have any new or worsening symptoms fever, chills, nausea, vomiting, chest pain, worsening cough, shortness of breath, chest pressure, wheezing, abdominal pain, changes in bowel or bladder habits, etc..Marland Kitchen

## 2018-07-12 NOTE — ED Triage Notes (Signed)
cough for one week, patient has a runny nose

## 2018-07-12 NOTE — ED Provider Notes (Signed)
[facility]   [id number] 07/12/18 Arrival Time: 0258   CC: URI symptoms   SUBJECTIVE: History from: patient.  Harrison Zetina is a 48 y.o. male who presents with worsening dry cough and runny nose x 1 week.  Denies sick exposure to COVID, flu or strep.  Denies recent travel. Has tried OTC Nyquil and claritin daily without relief.  Denies aggravating factors.  Reports previous symptoms in the past with allergies.  Denies fever, chills, fatigue, sinus pain, sore throat, SOB, wheezing, chest pain, chest pressure, nausea, changes in bowel or bladder habits.    ROS: As per HPI.  Past Medical History:  Diagnosis Date  . GERD (gastroesophageal reflux disease)   . History of hemorrhoids 07/30/2014   Past Surgical History:  Procedure Laterality Date  . EXTERNAL EAR SURGERY    . history of nose surgery     . INGUINAL HERNIA REPAIR Right 01/09/2018   Procedure: [place];  Surgeon: Johnathan Hausen, MD;  Location: [facility];  Service: General;  Laterality: Right;  . INSERTION OF MESH Right 01/09/2018   Procedure: INSERTION OF MESH;  Surgeon: Johnathan Hausen, MD;  Location: [facility];  Service: General;  Laterality: Right;   No Known Allergies No current facility-administered medications on file prior to encounter.    Current Outpatient Medications on File Prior to Encounter  Medication Sig Dispense Refill  . loratadine (CLARITIN) 10 MG tablet Take 10 mg by mouth daily.    . Pseudoeph-Doxylamine-DM-APAP (NYQUIL PO) Take by mouth.    . imiquimod (ALDARA) 5 % cream APPLY TOPICALLY 3 (THREE) TIMES A WEEK. (Patient taking differently: Apply 1 application topically 3 (three) times a week. ) 12 each 1  . PROCTOFOAM [facility] rectal foam PLACE 1 APPLICATION RECTALLY 3 (THREE) TIMES DAILY AS NEEDED FOR ANAL ITCHING. (Patient taking differently: Place 1 applicator rectally 3 (three) times daily as needed for anal itching. ) 10 g 0  . RESTASIS 0.05 % ophthalmic emulsion  Place 1 drop into both eyes 2 (two) times daily.  3   Social History   Socioeconomic History  . Marital status: Married    Spouse name: Not on file  . Number of children: Not on file  . Years of education: Not on file  . Highest education level: Not on file  Occupational History  . Not on file  Social Needs  . Financial resource strain: Not on file  . Food insecurity:    Worry: Not on file    Inability: Not on file  . Transportation needs:    Medical: Not on file    Non-medical: Not on file  Tobacco Use  . Smoking status: Never Smoker  . Smokeless tobacco: Never Used  . Tobacco comment: patient smokes 1-2 times weekly  Substance and Sexual Activity  . Alcohol use: No    Alcohol/week: 0.0 standard drinks  . Drug use: No  . Sexual activity: Never  Lifestyle  . Physical activity:    Days per week: Not on file    Minutes per session: Not on file  . Stress: Not on file  Relationships  . Social connections:    Talks on phone: Not on file    Gets together: Not on file    Attends religious service: Not on file    Active member of club or organization: Not on file    Attends meetings of clubs or organizations: Not on file    Relationship status: Not on file  .  Intimate partner violence:    Fear of current or ex partner: Not on file    Emotionally abused: Not on file    Physically abused: Not on file    Forced sexual activity: Not on file  Other Topics Concern  . Not on file  Social History Narrative  . Not on file   Family History  Problem Relation Age of Onset  . Diabetes Mother   . Cancer Mother        colon cancer   . Heart disease Father   . Stroke Father     OBJECTIVE:  Vitals:   07/12/18 1416  BP: (!) 114/54  Pulse: 70  Resp: 18  Temp: 98.5 F (36.9 C)  TempSrc: Oral  SpO2: 99%     General appearance: alert; well-appearing, nontoxic; speaking in full sentences and tolerating own secretions HEENT: NCAT; Ears: EACs clear, TMs pearly gray; Eyes:  PERRL.  EOM grossly intact. Nose: nares patent with mild rhinorrhea, Throat: oropharynx clear, tonsils non erythematous or enlarged, uvula midline  Neck: supple without LAD Lungs: unlabored respirations, symmetrical air entry; cough: mild; no respiratory distress; CTAB Heart: regular rate and rhythm.  Radial pulses 2+ symmetrical bilaterally Skin: warm and dry Psychological: alert and cooperative; normal mood and affect  DIAGNOSTIC STUDIES:  Dg Chest 2 View  Result Date: 07/12/2018 CLINICAL DATA:  48 year old male with dry cough for the past 5 days EXAM: CHEST - 2 VIEW COMPARISON:  None. FINDINGS: The cardiac and mediastinal contours are within normal limits. Mild bronchitic changes and interstitial prominence diffusely. No focal airspace consolidation, pulmonary edema, pleural effusion or pneumothorax. No acute osseous abnormality. IMPRESSION: 1. No acute cardiopulmonary process. 2. Mild bronchitic changes and interstitial prominence are favored to be chronic in nature. Electronically Signed   By: Jacqulynn Cadet M.D.   On: 07/12/2018 14:57     ASSESSMENT & PLAN:  1. Viral URI with cough     Meds ordered this encounter  Medications  . albuterol (PROVENTIL HFA;VENTOLIN HFA) 108 (90 Base) MCG/ACT inhaler 2 puff  . cetirizine-pseudoephedrine (ZYRTEC-D) 5-120 MG tablet    Sig: Take 1 tablet by mouth daily.    Dispense:  30 tablet    Refill:  0    Order Specific Question:   Supervising Provider    Answer:   Raylene Everts [[id number]]  . fluticasone (FLONASE) 50 MCG/ACT nasal spray    Sig: Place 2 sprays into both nostrils daily.    Dispense:  16 g    Refill:  0    Order Specific Question:   Supervising Provider    Answer:   Raylene Everts [[id number]]  . benzonatate (TESSALON) 100 MG capsule    Sig: Take 1 capsule (100 mg total) by mouth every 8 (eight) hours.    Dispense:  21 capsule    Refill:  0    Order Specific Question:   Supervising Provider    Answer:   Raylene Everts [[id number]]   X-rays did not show pneumonia.  Symptoms most likely viral Get plenty of rest and push fluids Tessalon Perles prescribed for cough Discontinue claritin Zyrtec-D prescribed for nasal congestion, runny nose, and/or sore throat Flonase prescribed for nasal congestion and runny nose Albuterol inhaler as needed for cough spasms Use OTC medications like ibuprofen or tylenol as needed fever or pain Follow up with PCP or with [facility] if symptoms persist CALL or go to ER if you have any new or worsening symptoms fever, chills,  nausea, vomiting, chest pain, worsening cough, shortness of breath, chest pressure, wheezing, abdominal pain, changes in bowel or bladder habits, etc...   Reviewed expectations re: course of current medical issues. Questions answered. Outlined signs and symptoms indicating need for more acute intervention. Patient verbalized understanding. After Visit Summary given.         Lestine Box, PA-C 07/12/18 1523

## 2018-07-27 ENCOUNTER — Telehealth (HOSPITAL_COMMUNITY): Payer: Self-pay

## 2018-07-27 MED ORDER — BENZONATATE 100 MG PO CAPS: 100.0000 mg | ORAL_CAPSULE | Freq: Three times a day (TID) | ORAL | 0 refills | Status: AC

## 2020-07-17 ENCOUNTER — Other Ambulatory Visit (HOSPITAL_COMMUNITY): Payer: Self-pay | Admitting: Surgery

## 2020-07-17 ENCOUNTER — Other Ambulatory Visit: Payer: Self-pay | Admitting: Surgery

## 2020-07-17 DIAGNOSIS — K4090 Unilateral inguinal hernia, without obstruction or gangrene, not specified as recurrent: Secondary | ICD-10-CM

## 2020-07-25 ENCOUNTER — Encounter (HOSPITAL_COMMUNITY): Payer: Self-pay

## 2020-07-25 ENCOUNTER — Other Ambulatory Visit: Payer: Self-pay

## 2020-07-25 ENCOUNTER — Ambulatory Visit (HOSPITAL_COMMUNITY)
Admission: RE | Admit: 2020-07-25 | Discharge: 2020-07-25 | Disposition: A | Discharge status: Home / Self Care | Payer: Medicaid Other | Source: Ambulatory Visit | Attending: Surgery | Admitting: Surgery

## 2020-07-25 DIAGNOSIS — K4090 Unilateral inguinal hernia, without obstruction or gangrene, not specified as recurrent: Secondary | ICD-10-CM | POA: Diagnosis not present

## 2020-07-25 LAB — POCT I-STAT CREATININE: Creatinine, Ser: 0.7 mg/dL (ref 0.61–1.24)

## 2020-07-25 MED ORDER — IOHEXOL 300 MG/ML  SOLN
100.0000 mL | Freq: Once | INTRAMUSCULAR | Status: AC | PRN
  Administered 2020-07-25: 100 mL via INTRAVENOUS

## 2021-05-19 IMAGING — CT CT ABD-PELV W/ CM
2 of 5 series · 15 of 46 positions shown, 17 images · IV contrast (omnipaque)
Comparison: None.

CLINICAL DATA: Right inguinal pain. History of right groin surgery
3 years ago.

EXAM:
CT ABDOMEN AND PELVIS WITH CONTRAST
TECHNIQUE: Multidetector CT imaging of the abdomen and pelvis was performed
using the standard protocol following bolus administration of
intravenous contrast.
CONTRAST:  100mL OMNIPAQUE IOHEXOL 300 MG/ML  SOLN

[Series 2: axial st · axial · 0.76mm/px · z∈[-489,-54]mm · 12 of 103 slices shown, 14 images]
[im 8/103  soft-tissue]
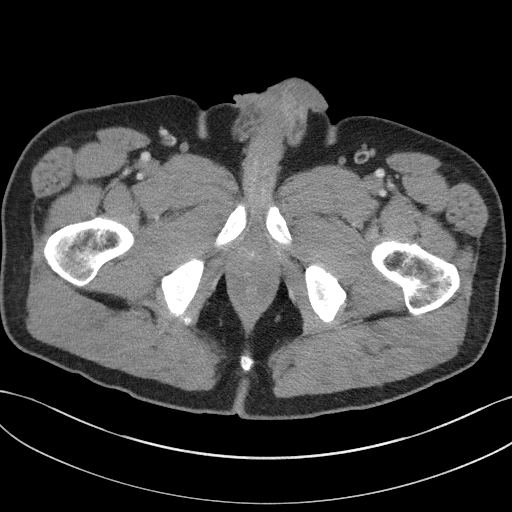
[im 8/103  bone]
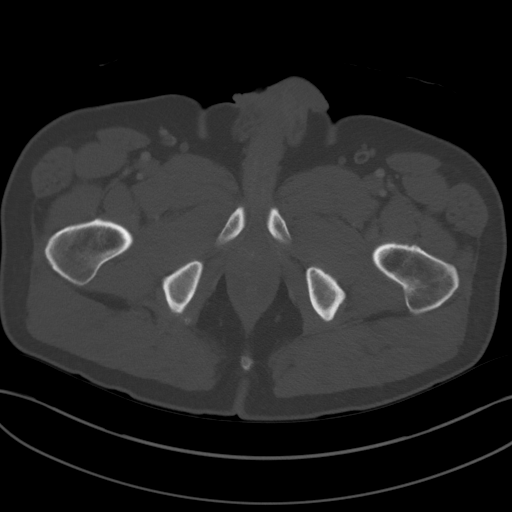
[im 16/103  soft-tissue]
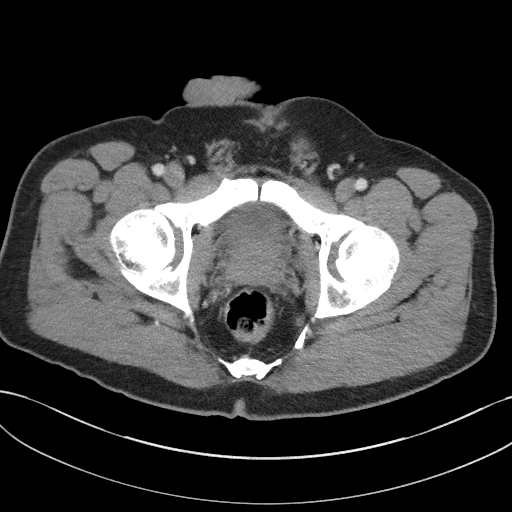
[im 24/103  soft-tissue]
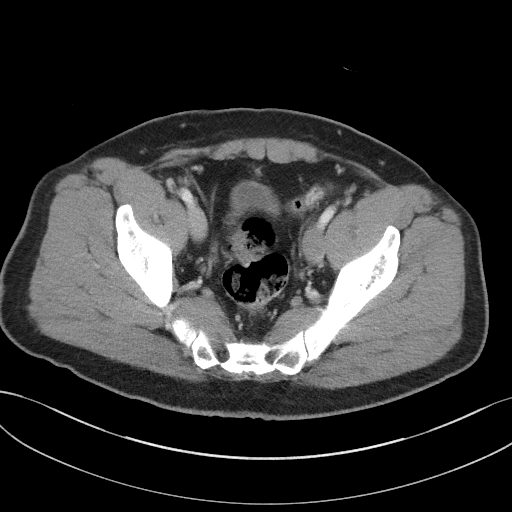
[im 32/103  soft-tissue]
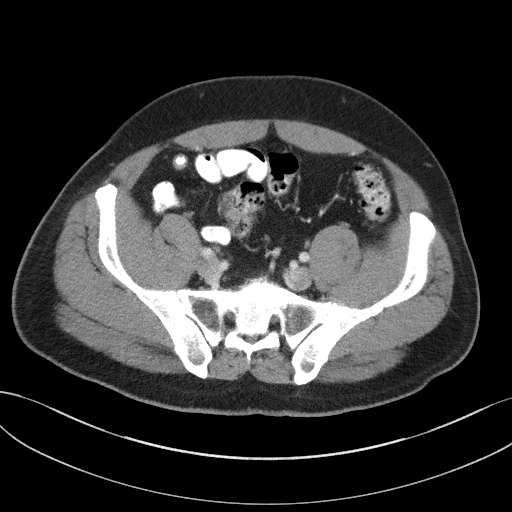
[im 40/103  soft-tissue]
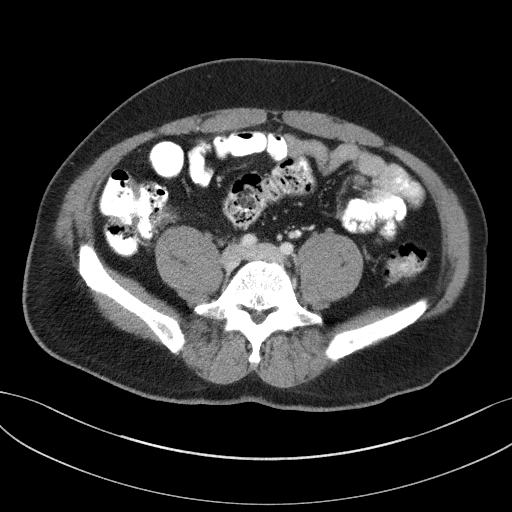
[im 48/103  soft-tissue]
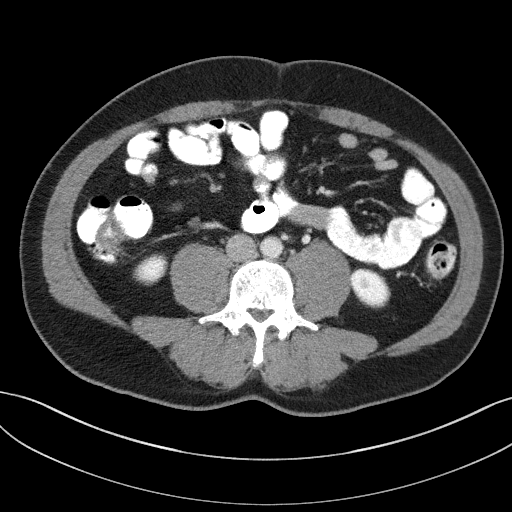
[im 55/103  soft-tissue]
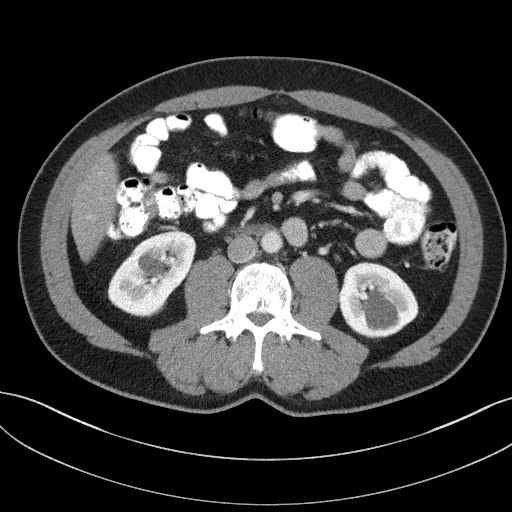
[im 63/103  soft-tissue]
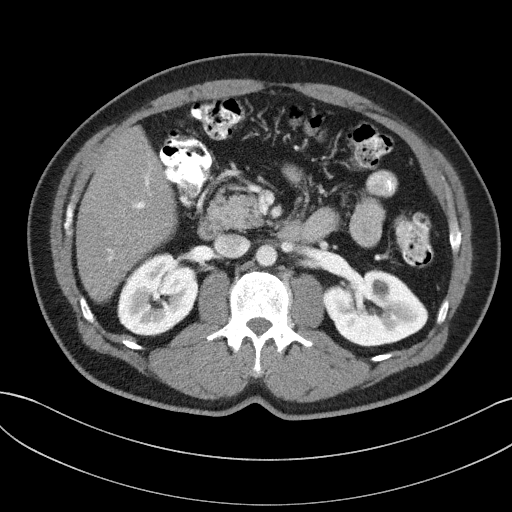
[im 71/103  soft-tissue]
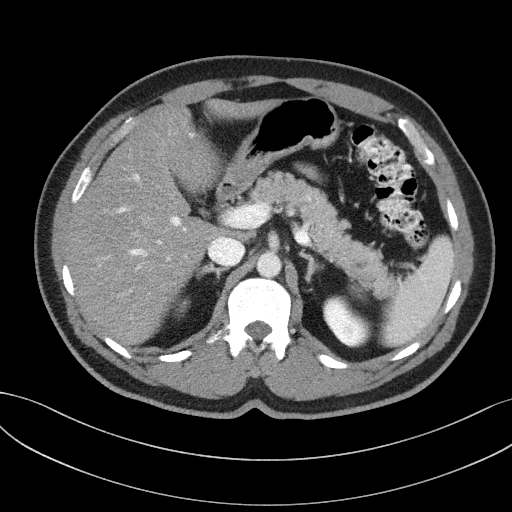
[im 71/103  bone]
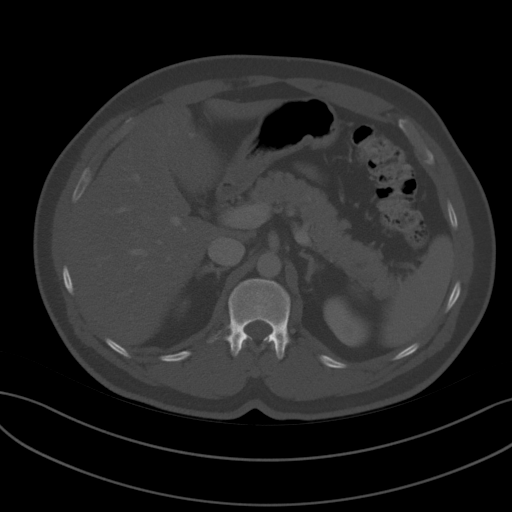
[im 79/103  soft-tissue]
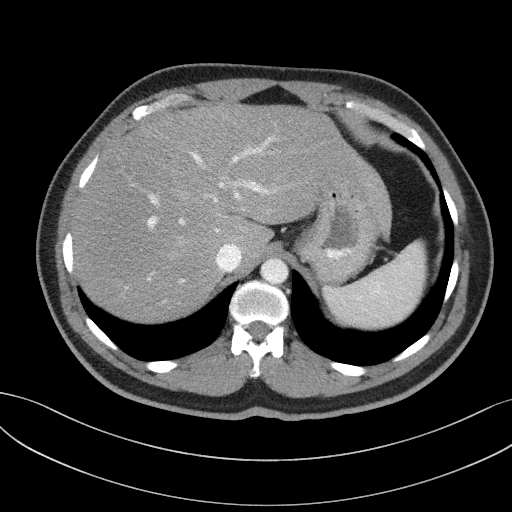
[im 87/103  soft-tissue]
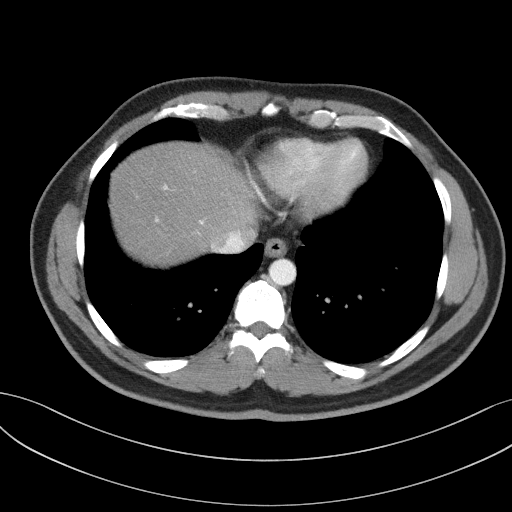
[im 95/103  soft-tissue]
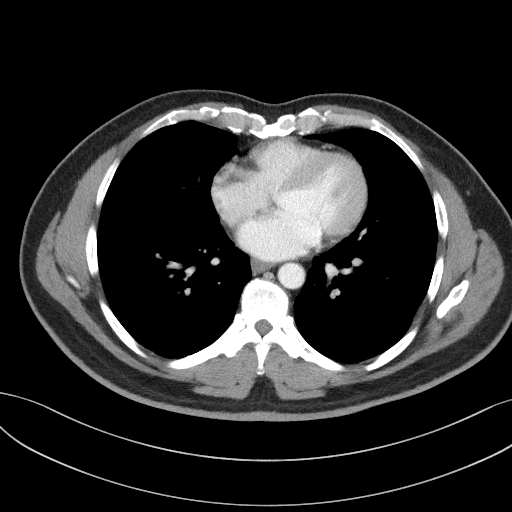

[Series 4: coronal st · coronal · 0.78mm/px · 3 of 100 slices shown]
[im 34/100  soft-tissue]
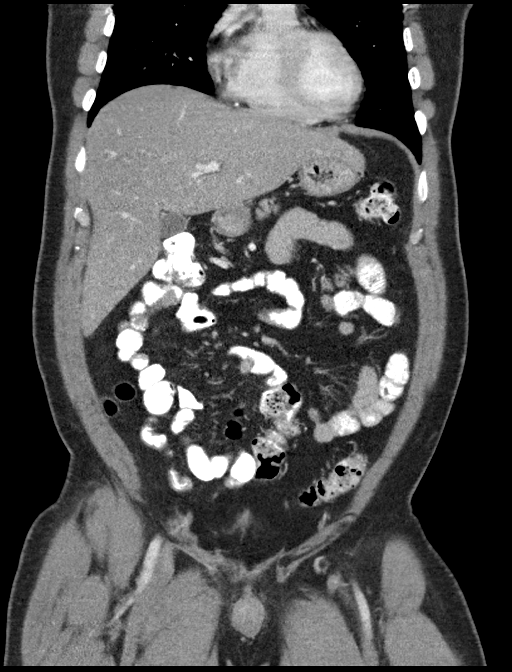
[im 45/100  soft-tissue]
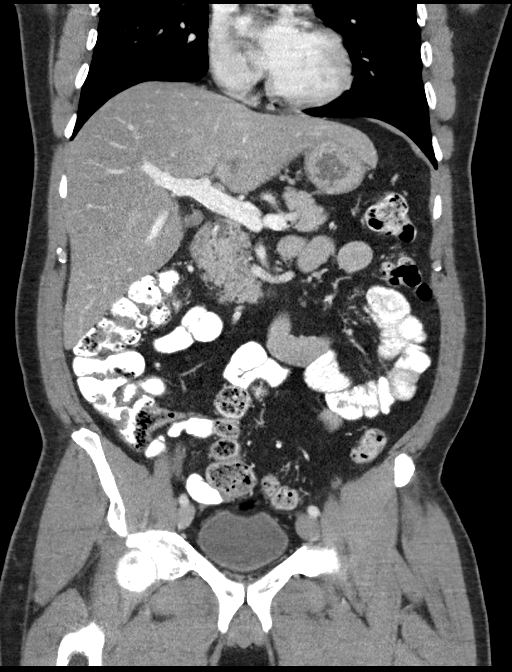
[im 56/100  soft-tissue]
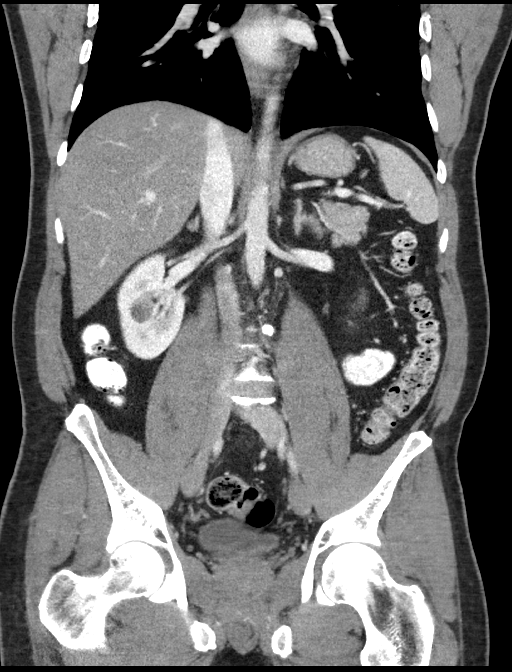

[15 of 46 positions shown; findings below may reference images not displayed]

FINDINGS: Lower chest: No acute abnormality.

Hepatobiliary: Hepatic steatosis. No focal liver abnormality is
seen. No gallstones, gallbladder wall thickening, or biliary
dilatation.

Pancreas: Unremarkable. No pancreatic ductal dilatation or
surrounding inflammatory changes.

Spleen: Normal in size without focal abnormality.

Adrenals/Urinary Tract: Adrenal glands are unremarkable. There are
several bilateral renal cysts, largest on the left measures 2.6 cm.
No hydronephrosis or renal calculi. Unremarkable urinary bladder.

Stomach/Bowel: Stomach is within normal limits. Appendix appears
normal. No evidence of bowel wall thickening, distention, or
inflammatory changes.

Vascular/Lymphatic: No significant vascular findings are present. No
enlarged abdominal or pelvic lymph nodes.

Reproductive: Prostate is unremarkable.

Other: Surgical changes from prior right inguinal hernia repair. No
recurrent hernia. No abdominopelvic ascites.

Musculoskeletal: No acute or significant osseous findings.
IMPRESSION: 1. No acute findings in the abdomen or pelvis.
2. Surgical changes from prior right inguinal hernia repair. No
recurrent hernia.
3. Hepatic steatosis.

## 2021-10-19 ENCOUNTER — Other Ambulatory Visit (HOSPITAL_COMMUNITY): Payer: Self-pay | Admitting: Neonatal-Perinatal Medicine

## 2021-10-19 ENCOUNTER — Other Ambulatory Visit: Payer: Self-pay | Admitting: Neonatal-Perinatal Medicine

## 2021-10-19 DIAGNOSIS — R0789 Other chest pain: Secondary | ICD-10-CM

## 2021-10-26 ENCOUNTER — Ambulatory Visit (HOSPITAL_COMMUNITY): Admission: RE | Admit: 2021-10-26 | Payer: Medicaid Other | Source: Ambulatory Visit

## 2021-10-26 ENCOUNTER — Encounter (HOSPITAL_COMMUNITY): Payer: Self-pay

## 2021-12-16 ENCOUNTER — Other Ambulatory Visit (HOSPITAL_COMMUNITY): Payer: Medicaid Other
